# Patient Record
Sex: Female | Born: 1985 | Race: White | Hispanic: No | Marital: Married | Smoking: Former smoker
Health system: Southern US, Community
[De-identification: ages and names within clinical notes are randomized; demographics above are authoritative.]

## PROBLEM LIST (undated history)

## (undated) DIAGNOSIS — R51 Headache: Secondary | ICD-10-CM

## (undated) DIAGNOSIS — I341 Nonrheumatic mitral (valve) prolapse: Secondary | ICD-10-CM

## (undated) DIAGNOSIS — K759 Inflammatory liver disease, unspecified: Secondary | ICD-10-CM

## (undated) DIAGNOSIS — F419 Anxiety disorder, unspecified: Secondary | ICD-10-CM

## (undated) DIAGNOSIS — K219 Gastro-esophageal reflux disease without esophagitis: Secondary | ICD-10-CM

## (undated) DIAGNOSIS — R519 Headache, unspecified: Secondary | ICD-10-CM

## (undated) HISTORY — DX: Headache, unspecified: R51.9

## (undated) HISTORY — DX: Gastro-esophageal reflux disease without esophagitis: K21.9

## (undated) HISTORY — DX: Anxiety disorder, unspecified: F41.9

## (undated) HISTORY — DX: Headache: R51

## (undated) HISTORY — DX: Nonrheumatic mitral (valve) prolapse: I34.1

## (undated) HISTORY — DX: Inflammatory liver disease, unspecified: K75.9

---

## 2010-06-11 HISTORY — PX: LEEP: SHX91

## 2011-01-31 ENCOUNTER — Ambulatory Visit: Payer: Self-pay | Admitting: Obstetrics and Gynecology

## 2011-02-06 ENCOUNTER — Ambulatory Visit: Payer: Self-pay | Admitting: Obstetrics and Gynecology

## 2012-08-08 ENCOUNTER — Ambulatory Visit: Payer: Self-pay

## 2012-08-09 ENCOUNTER — Inpatient Hospital Stay: Payer: Self-pay | Admitting: Obstetrics and Gynecology

## 2012-08-09 LAB — CBC WITH DIFFERENTIAL/PLATELET
Eosinophil #: 0.1 10*3/uL (ref 0.0–0.7)
HCT: 35.2 % (ref 35.0–47.0)
HGB: 11.8 g/dL — ABNORMAL LOW (ref 12.0–16.0)
Lymphocyte %: 14.4 %
MCV: 95 fL (ref 80–100)
Monocyte %: 5.6 %
Neutrophil #: 11 10*3/uL — ABNORMAL HIGH (ref 1.4–6.5)
Platelet: 245 10*3/uL (ref 150–440)
RDW: 14 % (ref 11.5–14.5)
WBC: 14 10*3/uL — ABNORMAL HIGH (ref 3.6–11.0)

## 2012-08-10 LAB — HEMATOCRIT: HCT: 29.8 % — ABNORMAL LOW (ref 35.0–47.0)

## 2012-12-02 ENCOUNTER — Other Ambulatory Visit: Payer: Self-pay | Admitting: Family

## 2012-12-02 LAB — BASIC METABOLIC PANEL
Anion Gap: 3 — ABNORMAL LOW (ref 7–16)
Calcium, Total: 8.7 mg/dL (ref 8.5–10.1)
Chloride: 107 mmol/L (ref 98–107)
Co2: 30 mmol/L (ref 21–32)
Glucose: 92 mg/dL (ref 65–99)
Potassium: 4.8 mmol/L (ref 3.5–5.1)
Sodium: 140 mmol/L (ref 136–145)

## 2012-12-02 LAB — CBC WITH DIFFERENTIAL/PLATELET
Basophil %: 0.4 %
HGB: 13.1 g/dL (ref 12.0–16.0)
Lymphocyte #: 1.7 10*3/uL (ref 1.0–3.6)
Lymphocyte %: 23.8 %
MCH: 30.9 pg (ref 26.0–34.0)
MCHC: 34.9 g/dL (ref 32.0–36.0)
MCV: 89 fL (ref 80–100)
Monocyte #: 0.4 x10 3/mm (ref 0.2–0.9)
Monocyte %: 5.5 %
Neutrophil #: 4.9 10*3/uL (ref 1.4–6.5)
WBC: 7.2 10*3/uL (ref 3.6–11.0)

## 2012-12-02 LAB — LIPID PANEL
Cholesterol: 153 mg/dL (ref 0–200)
Triglycerides: 104 mg/dL (ref 0–200)
VLDL Cholesterol, Calc: 21 mg/dL (ref 5–40)

## 2012-12-02 LAB — TSH: Thyroid Stimulating Horm: 1.64 u[IU]/mL

## 2012-12-29 ENCOUNTER — Ambulatory Visit: Payer: Self-pay | Admitting: Family

## 2013-10-09 ENCOUNTER — Encounter: Payer: Self-pay | Admitting: Gastroenterology

## 2013-12-04 ENCOUNTER — Ambulatory Visit (INDEPENDENT_AMBULATORY_CARE_PROVIDER_SITE_OTHER): Payer: BC Managed Care – PPO | Admitting: Gastroenterology

## 2013-12-04 ENCOUNTER — Encounter: Payer: Self-pay | Admitting: Gastroenterology

## 2013-12-04 VITALS — BP 106/70 | HR 64 | Ht 63.75 in | Wt 172.4 lb

## 2013-12-04 DIAGNOSIS — R09A2 Foreign body sensation, throat: Secondary | ICD-10-CM | POA: Insufficient documentation

## 2013-12-04 DIAGNOSIS — K219 Gastro-esophageal reflux disease without esophagitis: Secondary | ICD-10-CM

## 2013-12-04 DIAGNOSIS — F458 Other somatoform disorders: Secondary | ICD-10-CM | POA: Insufficient documentation

## 2013-12-04 DIAGNOSIS — F449 Dissociative and conversion disorder, unspecified: Secondary | ICD-10-CM

## 2013-12-04 MED ORDER — HYOSCYAMINE SULFATE 0.125 MG SL SUBL: 0.2500 mg | SUBLINGUAL_TABLET | SUBLINGUAL | Status: DC | PRN

## 2013-12-04 NOTE — Progress Notes (Signed)
    _                                                                                                                History of Present Illness: 28 year old white female referred for evaluation of abdominal discomfort and chest discomfort.  For the past year she's had intermittent pyrosis and postprandial discomfort described as a gnawing pain in the upper abdomen and chest.  She denies dysphagia or odynophagia.  At times she spontaneously has what sounds like a globus sensation.  She is taking Nexium with improvement.  Symptoms  tend to recur if she stops Nexium.  She is on no regular gastric irritants including nonsteroidals.    Past Medical History  Diagnosis Date  . Anxiety   . Mitral valve prolapse     Mild   Past Surgical History  Procedure Laterality Date  . Leep  2012    Precancerous cells   family history includes Diabetes in her father, maternal grandfather, maternal grandmother, mother, paternal grandfather, paternal grandmother, and paternal uncle; Heart attack in her mother; Uterine cancer in her maternal grandmother and paternal aunt. Current Outpatient Prescriptions  Medication Sig Dispense Refill  . ALPRAZolam (XANAX) 0.25 MG tablet Take 0.25 mg by mouth as needed for anxiety.      . Esomeprazole Magnesium (NEXIUM 24HR PO) Take 2 tablets by mouth daily.      . norgestimate-ethinyl estradiol (ORTHO-CYCLEN,SPRINTEC,PREVIFEM) 0.25-35 MG-MCG tablet Take 1 tablet by mouth daily.       No current facility-administered medications for this visit.   Allergies as of 12/04/2013 - Review Complete 12/04/2013  Allergen Reaction Noted  . Penicillins Itching 12/04/2013    reports that she quit smoking about 2 years ago. Her smoking use included Cigarettes. She smoked 0.00 packs per day. She has never used smokeless tobacco. She reports that she does not drink alcohol or use illicit drugs.     Review of Systems: Pertinent positive and negative review of systems were  noted in the above HPI section. All other review of systems were otherwise negative.  Vital signs were reviewed in today's medical record Physical Exam: General: Well developed , well nourished, no acute distress Skin: anicteric Head: Normocephalic and atraumatic Eyes:  sclerae anicteric, EOMI Ears: Normal auditory acuity Mouth: No deformity or lesions Neck: Supple, no masses or thyromegaly Lungs: Clear throughout to auscultation Heart: Regular rate and rhythm; no murmurs, rubs or bruits Abdomen: Soft, non tender and non distended. No masses, hepatosplenomegaly or hernias noted. Normal Bowel sounds Rectal:deferred Musculoskeletal: Symmetrical with no gross deformities  Skin: No lesions on visible extremities Pulses:  Normal pulses noted Extremities: No clubbing, cyanosis, edema or deformities noted Neurological: Alert oriented x 4, grossly nonfocal Cervical Nodes:  No significant cervical adenopathy Inguinal Nodes: No significant inguinal adenopathy Psychological:  Alert and cooperative. Normal mood and affect  See Assessment and Plan under Problem List

## 2013-12-04 NOTE — Assessment & Plan Note (Signed)
Symptoms are fairly well-controlled with Nexium.  She was instructed to continue for 2 more months.  I'll prescribe hyomax for nonspecific upper abdominal pain.  Literature on GERD was provided to the patient.

## 2013-12-04 NOTE — Assessment & Plan Note (Signed)
Patient has a history of anxiety.  I explained to her that symptoms of globus probably are a reflection of anxiety.

## 2013-12-04 NOTE — Patient Instructions (Signed)
Gastroesophageal Reflux Disease, Adult  Gastroesophageal reflux disease (GERD) happens when acid from your stomach flows up into the esophagus. When acid comes in contact with the esophagus, the acid causes soreness (inflammation) in the esophagus. Over time, GERD may create small holes (ulcers) in the lining of the esophagus.  CAUSES   · Increased body weight. This puts pressure on the stomach, making acid rise from the stomach into the esophagus.  · Smoking. This increases acid production in the stomach.  · Drinking alcohol. This causes decreased pressure in the lower esophageal sphincter (valve or ring of muscle between the esophagus and stomach), allowing acid from the stomach into the esophagus.  · Late evening meals and a full stomach. This increases pressure and acid production in the stomach.  · A malformed lower esophageal sphincter.  Sometimes, no cause is found.  SYMPTOMS   · Burning pain in the lower part of the mid-chest behind the breastbone and in the mid-stomach area. This may occur twice a week or more often.  · Trouble swallowing.  · Sore throat.  · Dry cough.  · Asthma-like symptoms including chest tightness, shortness of breath, or wheezing.  DIAGNOSIS   Your caregiver may be able to diagnose GERD based on your symptoms. In some cases, X-rays and other tests may be done to check for complications or to check the condition of your stomach and esophagus.  TREATMENT   Your caregiver may recommend over-the-counter or prescription medicines to help decrease acid production. Ask your caregiver before starting or adding any new medicines.   HOME CARE INSTRUCTIONS   · Change the factors that you can control. Ask your caregiver for guidance concerning weight loss, quitting smoking, and alcohol consumption.  · Avoid foods and drinks that make your symptoms worse, such as:  ¨ Caffeine or alcoholic drinks.  ¨ Chocolate.  ¨ Peppermint or mint flavorings.  ¨ Garlic and onions.  ¨ Spicy foods.  ¨ Citrus fruits,  such as oranges, lemons, or limes.  ¨ Tomato-based foods such as sauce, chili, salsa, and pizza.  ¨ Fried and fatty foods.  · Avoid lying down for the 3 hours prior to your bedtime or prior to taking a nap.  · Eat small, frequent meals instead of large meals.  · Wear loose-fitting clothing. Do not wear anything tight around your waist that causes pressure on your stomach.  · Raise the head of your bed 6 to 8 inches with wood blocks to help you sleep. Extra pillows will not help.  · Only take over-the-counter or prescription medicines for pain, discomfort, or fever as directed by your caregiver.  · Do not take aspirin, ibuprofen, or other nonsteroidal anti-inflammatory drugs (NSAIDs).  SEEK IMMEDIATE MEDICAL CARE IF:   · You have pain in your arms, neck, jaw, teeth, or back.  · Your pain increases or changes in intensity or duration.  · You develop nausea, vomiting, or sweating (diaphoresis).  · You develop shortness of breath, or you faint.  · Your vomit is green, yellow, black, or looks like coffee grounds or blood.  · Your stool is red, bloody, or black.  These symptoms could be signs of other problems, such as heart disease, gastric bleeding, or esophageal bleeding.  MAKE SURE YOU:   · Understand these instructions.  · Will watch your condition.  · Will get help right away if you are not doing well or get worse.  Document Released: 03/07/2005 Document Revised: 08/20/2011 Document Reviewed: 12/15/2010  ExitCare® Patient   Information ©2015 [employer]. This information is not intended to replace advice given to you by your health care Joeziah Voit. Make sure you discuss any questions you have with your health care Dymphna Wadley.  Food Choices for Gastroesophageal Reflux Disease  When you have gastroesophageal reflux disease (GERD), the foods you eat and your eating habits are very important. Choosing the right foods can help ease the discomfort of GERD.  WHAT GENERAL GUIDELINES DO I NEED TO FOLLOW?  · Choose fruits,  vegetables, whole grains, low-fat dairy products, and low-fat meat, fish, and poultry.  · Limit fats such as oils, salad dressings, butter, nuts, and avocado.  · Keep a food diary to identify foods that cause symptoms.  · Avoid foods that cause reflux. These may be different for different people.  · Eat frequent small meals instead of three large meals each day.  · Eat your meals slowly, in a relaxed setting.  · Limit fried foods.  · Cook foods using methods other than frying.  · Avoid drinking alcohol.  · Avoid drinking large amounts of liquids with your meals.  · Avoid bending over or lying down until 2-3 hours after eating.  WHAT FOODS ARE NOT RECOMMENDED?  The following are some foods and drinks that may worsen your symptoms:  Vegetables  Tomatoes. Tomato juice. Tomato and spaghetti sauce. Chili peppers. Onion and garlic. Horseradish.  Fruits  Oranges, grapefruit, and lemon (fruit and juice).  Meats  High-fat meats, fish, and poultry. This includes hot dogs, ribs, ham, sausage, salami, and bacon.  Dairy  Whole milk and chocolate milk. Sour cream. Cream. Butter. Ice cream. Cream cheese.   Beverages  Coffee and tea, with or without caffeine. Carbonated beverages or energy drinks.  Condiments  Hot sauce. Barbecue sauce.   Sweets/Desserts  Chocolate and cocoa. Donuts. Peppermint and spearmint.  Fats and Oils  High-fat foods, including French fries and potato chips.  Other  Vinegar. Strong spices, such as black pepper, white pepper, red pepper, cayenne, curry powder, cloves, ginger, and chili powder.  The items listed above may not be a complete list of foods and beverages to avoid. Contact your dietitian for more information.  Document Released: 05/28/2005 Document Revised: 06/02/2013 Document Reviewed: 04/01/2013  ExitCare® Patient Information ©2015 [employer]. This information is not intended to replace advice given to you by your health care Chaze Hruska. Make sure you discuss any questions you have with your  health care Janani Chamber.

## 2014-10-19 NOTE — H&P (Signed)
L&D Evaluation:  History:  HPI G3P1011 at [redacted]w[redacted]d presents with c/o LOF at 8:10am today, now with worsening contractions.  PNC at [facility].  PNL - B+, RI, VI, ASCUS-H pap 3/13, normal pap 7/13, GBS neg.  Recieved TDaP 06/16/12.   Presents with leaking fluid   Patient's Medical History No Chronic Illness   Patient's Surgical History LEEP   Medications Pre Natal Vitamins   Allergies PCN   Social History none   Family History Non-Contributory   ROS:  ROS All systems were reviewed.  HEENT, CNS, GI, GU, Respiratory, CV, Renal and Musculoskeletal systems were found to be normal.   Exam:  Vital Signs stable   General no apparent distress   Mental Status clear   Chest nl effort   Abdomen gravid, non-tender   Estimated Fetal Weight Average for gestational age, 9#   Pelvic no external lesions, 5 / 44 / -2, artificial rupture of membranes of forebag   Mebranes Ruptured   Description clear   FHT reactive NST   Ucx irregular, every 3-7 mins   Impression:  Impression G3P1 at [redacted]w[redacted]d with SROM this am.   Plan:  Plan EFM/NST, monitor contractions and for cervical change   Comments - will augment with pitocin if needed   Electronic Signatures: Malachi Paradise (MD)  (Signed 01-Mar-14 11:24)  Authored: L&D Evaluation   Last Updated: 01-Mar-14 11:24 by Thurmond Butts, Elinor Parkinson (MD)

## 2015-09-27 DIAGNOSIS — Z8639 Personal history of other endocrine, nutritional and metabolic disease: Secondary | ICD-10-CM | POA: Diagnosis not present

## 2015-09-27 DIAGNOSIS — Z01419 Encounter for gynecological examination (general) (routine) without abnormal findings: Secondary | ICD-10-CM | POA: Diagnosis not present

## 2015-10-25 ENCOUNTER — Emergency Department
Admission: EM | Admit: 2015-10-25 | Discharge: 2015-10-25 | Disposition: A | Discharge status: Home / Self Care | Payer: BLUE CROSS/BLUE SHIELD | Attending: Emergency Medicine | Admitting: Emergency Medicine

## 2015-10-25 ENCOUNTER — Encounter: Payer: Self-pay | Admitting: Emergency Medicine

## 2015-10-25 DIAGNOSIS — R51 Headache: Secondary | ICD-10-CM | POA: Insufficient documentation

## 2015-10-25 DIAGNOSIS — N390 Urinary tract infection, site not specified: Secondary | ICD-10-CM | POA: Diagnosis not present

## 2015-10-25 DIAGNOSIS — Z87891 Personal history of nicotine dependence: Secondary | ICD-10-CM | POA: Insufficient documentation

## 2015-10-25 DIAGNOSIS — R42 Dizziness and giddiness: Secondary | ICD-10-CM

## 2015-10-25 DIAGNOSIS — Z79899 Other long term (current) drug therapy: Secondary | ICD-10-CM | POA: Insufficient documentation

## 2015-10-25 DIAGNOSIS — R519 Headache, unspecified: Secondary | ICD-10-CM

## 2015-10-25 LAB — URINALYSIS COMPLETE WITH MICROSCOPIC (ARMC ONLY)
Bilirubin Urine: NEGATIVE
Glucose, UA: NEGATIVE mg/dL
HGB URINE DIPSTICK: NEGATIVE
KETONES UR: NEGATIVE mg/dL
NITRITE: NEGATIVE
PH: 7 (ref 5.0–8.0)
PROTEIN: NEGATIVE mg/dL
Specific Gravity, Urine: 1.01 (ref 1.005–1.030)

## 2015-10-25 LAB — MAGNESIUM: Magnesium: 2 mg/dL (ref 1.7–2.4)

## 2015-10-25 LAB — CBC WITH DIFFERENTIAL/PLATELET
Basophils Absolute: 0 10*3/uL (ref 0–0.1)
EOS ABS: 0.1 10*3/uL (ref 0–0.7)
HCT: 40 % (ref 35.0–47.0)
HEMOGLOBIN: 13.7 g/dL (ref 12.0–16.0)
LYMPHS ABS: 1.1 10*3/uL (ref 1.0–3.6)
Lymphocytes Relative: 12 %
MCH: 30.3 pg (ref 26.0–34.0)
MCHC: 34.3 g/dL (ref 32.0–36.0)
MCV: 88.4 fL (ref 80.0–100.0)
MONO ABS: 0.4 10*3/uL (ref 0.2–0.9)
Neutro Abs: 8.1 10*3/uL — ABNORMAL HIGH (ref 1.4–6.5)
PLATELETS: 241 10*3/uL (ref 150–440)
RBC: 4.52 MIL/uL (ref 3.80–5.20)
RDW: 13.5 % (ref 11.5–14.5)
WBC: 9.7 10*3/uL (ref 3.6–11.0)

## 2015-10-25 LAB — BASIC METABOLIC PANEL
Anion gap: 5 (ref 5–15)
BUN: 15 mg/dL (ref 6–20)
CHLORIDE: 103 mmol/L (ref 101–111)
CO2: 28 mmol/L (ref 22–32)
Calcium: 9.2 mg/dL (ref 8.9–10.3)
Creatinine, Ser: 0.61 mg/dL (ref 0.44–1.00)
GLUCOSE: 117 mg/dL — AB (ref 65–99)
Potassium: 3.8 mmol/L (ref 3.5–5.1)
SODIUM: 136 mmol/L (ref 135–145)

## 2015-10-25 LAB — POCT PREGNANCY, URINE: PREG TEST UR: NEGATIVE

## 2015-10-25 MED ORDER — METOCLOPRAMIDE HCL 5 MG/ML IJ SOLN
INTRAMUSCULAR | Status: AC
  Administered 2015-10-25: 10 mg via INTRAVENOUS
  Filled 2015-10-25: qty 2

## 2015-10-25 MED ORDER — MECLIZINE HCL 25 MG PO TABS: 25.0000 mg | ORAL_TABLET | Freq: Three times a day (TID) | ORAL | Status: DC | PRN

## 2015-10-25 MED ORDER — SODIUM CHLORIDE 0.9 % IV BOLUS (SEPSIS)
500.0000 mL | INTRAVENOUS | Status: AC
  Administered 2015-10-25: 500 mL via INTRAVENOUS

## 2015-10-25 MED ORDER — DEXAMETHASONE SODIUM PHOSPHATE 10 MG/ML IJ SOLN
10.0000 mg | Freq: Once | INTRAMUSCULAR | Status: AC
  Administered 2015-10-25: 10 mg via INTRAVENOUS

## 2015-10-25 MED ORDER — KETOROLAC TROMETHAMINE 30 MG/ML IJ SOLN
10.0000 mg | Freq: Once | INTRAMUSCULAR | Status: AC
  Administered 2015-10-25: 9.9 mg via INTRAVENOUS

## 2015-10-25 MED ORDER — CEPHALEXIN 500 MG PO CAPS: 500.0000 mg | ORAL_CAPSULE | Freq: Two times a day (BID) | ORAL | Status: DC

## 2015-10-25 MED ORDER — METOCLOPRAMIDE HCL 5 MG/ML IJ SOLN
10.0000 mg | INTRAMUSCULAR | Status: AC
  Administered 2015-10-25: 10 mg via INTRAVENOUS

## 2015-10-25 MED ORDER — DIPHENHYDRAMINE HCL 50 MG/ML IJ SOLN
12.5000 mg | Freq: Once | INTRAMUSCULAR | Status: AC
  Administered 2015-10-25: 12.5 mg via INTRAVENOUS

## 2015-10-25 MED ORDER — DIPHENHYDRAMINE HCL 50 MG/ML IJ SOLN
INTRAMUSCULAR | Status: AC
  Administered 2015-10-25: 12.5 mg via INTRAVENOUS
  Filled 2015-10-25: qty 1

## 2015-10-25 MED ORDER — DEXAMETHASONE SODIUM PHOSPHATE 10 MG/ML IJ SOLN
INTRAMUSCULAR | Status: AC
  Administered 2015-10-25: 10 mg via INTRAVENOUS
  Filled 2015-10-25: qty 1

## 2015-10-25 MED ORDER — KETOROLAC TROMETHAMINE 30 MG/ML IJ SOLN
INTRAMUSCULAR | Status: AC
  Administered 2015-10-25: 9.9 mg via INTRAVENOUS
  Filled 2015-10-25: qty 1

## 2015-10-25 NOTE — Discharge Instructions (Signed)
As we discussed, we do not believe that a serious or emergent medical condition is causing her symptoms.  It is likely related to benign vertigo or possibly an atypical migraine headache.  You have no neurological deficits at this time that we believe it is safe for you to follow up as an outpatient.  Please take the prescribed medications as indicated on the label instructions and follow up with your PCP at the next available opportunity.  Return to the emergency department if you develop new or worsening symptoms that concern you.   Benign Positional Vertigo Vertigo is the feeling that you or your surroundings are moving when they are not. Benign positional vertigo is the most common form of vertigo. The cause of this condition is not serious (is benign). This condition is triggered by certain movements and positions (is positional). This condition can be dangerous if it occurs while you are doing something that could endanger you or others, such as driving.  CAUSES In many cases, the cause of this condition is not known. It may be caused by a disturbance in an area of the inner ear that helps your brain to sense movement and balance. This disturbance can be caused by a viral infection (labyrinthitis), head injury, or repetitive motion. RISK FACTORS This condition is more likely to develop in:  Women.  People who are 45 years of age or older. SYMPTOMS Symptoms of this condition usually happen when you move your head or your eyes in different directions. Symptoms may start suddenly, and they usually last for less than a minute. Symptoms may include:  Loss of balance and falling.  Feeling like you are spinning or moving.  Feeling like your surroundings are spinning or moving.  Nausea and vomiting.  Blurred vision.  Dizziness.  Involuntary eye movement (nystagmus). Symptoms can be mild and cause only slight annoyance, or they can be severe and interfere with daily life. Episodes of benign  positional vertigo may return (recur) over time, and they may be triggered by certain movements. Symptoms may improve over time. DIAGNOSIS This condition is usually diagnosed by medical history and a physical exam of the head, neck, and ears. You may be referred to a health care provider who specializes in ear, nose, and throat (ENT) problems (otolaryngologist) or a provider who specializes in disorders of the nervous system (neurologist). You may have additional testing, including:  MRI.  A CT scan.  Eye movement tests. Your health care provider may ask you to change positions quickly while he or she watches you for symptoms of benign positional vertigo, such as nystagmus. Eye movement may be tested with an electronystagmogram (ENG), caloric stimulation, the Dix-Hallpike test, or the roll test.  An electroencephalogram (EEG). This records electrical activity in your brain.  Hearing tests. TREATMENT Usually, your health care provider will treat this by moving your head in specific positions to adjust your inner ear back to normal. Surgery may be needed in severe cases, but this is rare. In some cases, benign positional vertigo may resolve on its own in 2-4 weeks. HOME CARE INSTRUCTIONS Safety  Move slowly.Avoid sudden body or head movements.  Avoid driving.  Avoid operating heavy machinery.  Avoid doing any tasks that would be dangerous to you or others if a vertigo episode would occur.  If you have trouble walking or keeping your balance, try using a cane for stability. If you feel dizzy or unstable, sit down right away.  Return to your normal activities as told  by your health care provider. Ask your health care provider what activities are safe for you. General Instructions  Take over-the-counter and prescription medicines only as told by your health care provider.  Avoid certain positions or movements as told by your health care provider.  Drink enough fluid to keep your urine  clear or pale yellow.  Keep all follow-up visits as told by your health care provider. This is important. SEEK MEDICAL CARE IF:  You have a fever.  Your condition gets worse or you develop new symptoms.  Your family or friends notice any behavioral changes.  Your nausea or vomiting gets worse.  You have numbness or a "pins and needles" sensation. SEEK IMMEDIATE MEDICAL CARE IF:  You have difficulty speaking or moving.  You are always dizzy.  You faint.  You develop severe headaches.  You have weakness in your legs or arms.  You have changes in your hearing or vision.  You develop a stiff neck.  You develop sensitivity to light.   This information is not intended to replace advice given to you by your health care provider. Make sure you discuss any questions you have with your health care provider.   Document Released: 03/05/2006 Document Revised: 02/16/2015 Document Reviewed: 09/20/2014 Elsevier Interactive Patient Education 2016 [place] Headache Without Cause A headache is pain or discomfort felt around the head or neck area. There are many causes and types of headaches. In some cases, the cause may not be found.  HOME CARE  Managing Pain  Take over-the-counter and prescription medicines only as told by your doctor.  Lie down in a dark, quiet room when you have a headache.  If directed, apply ice to the head and neck area:  Put ice in a plastic bag.  Place a towel between your skin and the bag.  Leave the ice on for 20 minutes, 2-3 times per day.  Use a heating pad or hot shower to apply heat to the head and neck area as told by your doctor.  Keep lights dim if bright lights bother you or make your headaches worse. Eating and Drinking  Eat meals on a regular schedule.  Lessen how much alcohol you drink.  Lessen how much caffeine you drink, or stop drinking caffeine. General Instructions  Keep all follow-up visits as told by your  doctor. This is important.  Keep a journal to find out if certain things bring on headaches. For example, write down:  What you eat and drink.  How much sleep you get.  Any change to your diet or medicines.  Relax by getting a massage or doing other relaxing activities.  Lessen stress.  Sit up straight. Do not tighten (tense) your muscles.  Do not use tobacco products. This includes cigarettes, chewing tobacco, or e-cigarettes. If you need help quitting, ask your doctor.  Exercise regularly as told by your doctor.  Get enough sleep. This often means 7-9 hours of sleep. GET HELP IF:  Your symptoms are not helped by medicine.  You have a headache that feels different than the other headaches.  You feel sick to your stomach (nauseous) or you throw up (vomit).  You have a fever. GET HELP RIGHT AWAY IF:   Your headache becomes really bad.  You keep throwing up.  You have a stiff neck.  You have trouble seeing.  You have trouble speaking.  You have pain in the eye or ear.  Your muscles are weak or you lose  muscle control.  You lose your balance or have trouble walking.  You feel like you will pass out (faint) or you pass out.  You have confusion.   This information is not intended to replace advice given to you by your health care provider. Make sure you discuss any questions you have with your health care provider.   Document Released: 03/06/2008 Document Revised: 02/16/2015 Document Reviewed: 09/20/2014 Elsevier Interactive Patient Education [employer].

## 2015-10-25 NOTE — ED Notes (Signed)
AAOx3.  Skin warm and dry.  NAD.  Moving all extremities equally and strong.  Stands well.  Denies dizziness.  Ambulates with easy and steady gait.

## 2015-10-25 NOTE — ED Notes (Signed)
AAOx3.  Skin warm and dry.  NAD 

## 2015-10-25 NOTE — ED Provider Notes (Addendum)
[facility] Emergency Department Provider Note  ____________________________________________  Time seen: Approximately 3:11 PM  I have reviewed the triage vital signs and the nursing notes.   HISTORY  Chief Complaint Dizziness and Headache    HPI Kiara Rich is a 30 y.o. female with past medical history of anxiety and mitral valve prolapse who presents for evaluation of dizziness.  She reports that she woke up with it this morning.  It does feel similar to prior episodes she has had in the past but feels more severe.  She describes that when she first stood up she felt like she was being pulled to the right and off balance.  She has had a dull and throbbing headache today that is mild in intensity currently but was moderate earlier today.  She has not had any trauma or injuries.  She denies fever/chills, visual changes, chest pain, shortness of breath.  She had one episode of nausea and vomiting associated with her symptoms earlier today.  She did have similar symptoms yesterday and felt better after getting some more sleep.  She has been eating and drinking normally recently and has not had any dysuria, vomiting, nor diarrhea.  Nothing specifically made her feel better although she did say that the feeling of being off balance is gone, the dizziness is much better, and the headache is very mild at this time.  Nothing in particular made her symptoms worse.   Past Medical History  Diagnosis Date  . Anxiety   . Mitral valve prolapse     Mild    Patient Active Problem List   Diagnosis Date Noted  . Esophageal reflux 12/04/2013  . 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 Leep  2012    Precancerous cells    Current Outpatient Rx  Name  Route  Sig  Dispense  Refill  . ALPRAZolam (XANAX) 0.25 MG tablet   Oral   Take 0.25 mg by mouth as needed for anxiety.         . cephALEXin (KEFLEX) 500 MG capsule    Oral   Take 1 capsule (500 mg total) by mouth 2 (two) times daily.   14 capsule   0   . Esomeprazole Magnesium (NEXIUM 24HR PO)   Oral   Take 2 tablets by mouth daily.         . hyoscyamine (LEVSIN SL) 0.125 MG SL tablet   Sublingual   Place 2 tablets (0.25 mg total) under the tongue every 4 (four) hours as needed.   30 tablet   2   . meclizine (ANTIVERT) 25 MG tablet   Oral   Take 1 tablet (25 mg total) by mouth 3 (three) times daily as needed for dizziness.   30 tablet   0   . norgestimate-ethinyl estradiol (ORTHO-CYCLEN,SPRINTEC,PREVIFEM) 0.25-35 MG-MCG tablet   Oral   Take 1 tablet by mouth daily.           Allergies Penicillins  Family History  Problem Relation Age of Onset  . Uterine cancer Maternal Grandmother   . Diabetes Father   . Diabetes Mother   . Heart attack Mother   . Uterine cancer Paternal Aunt   . Diabetes Paternal Uncle   . Diabetes Paternal Grandfather   . Diabetes Paternal Grandmother   . Diabetes Maternal Grandfather   . Diabetes Maternal Grandmother     Social History Social History  Substance Use Topics  .  Smoking status: Former Smoker    Types: Cigarettes    Quit date: 06/12/2011  . Smokeless tobacco: Never Used  . Alcohol Use: No    Review of Systems Constitutional: No fever/chills Eyes: No visual changes. ENT: No sore throat. Cardiovascular: Denies chest pain. Respiratory: Denies shortness of breath. Gastrointestinal: No abdominal pain.  N/V x 1.  No diarrhea.  No constipation. Genitourinary: Negative for dysuria. Musculoskeletal: Negative for back pain. Skin: Negative for rash. Neurological: Dizziness when she got up this morning and felt off balance as if falling to the right.  Mild throbbing headache worse in the back of her head.  10-point ROS otherwise negative.  ____________________________________________   PHYSICAL EXAM:  VITAL SIGNS: ED Triage Vitals  Enc Vitals Group     BP 10/25/15 1135 106/59 mmHg      Pulse Rate 10/25/15 1135 57     Resp 10/25/15 1135 20     Temp 10/25/15 1135 97.7 F (36.5 C)     Temp Source 10/25/15 1135 Oral     SpO2 10/25/15 1135 99 %     Weight 10/25/15 1135 158 lb (71.668 kg)     Height 10/25/15 1135 5\' 4"  (1.626 m)     Head Cir --      Peak Flow --      Pain Score 10/25/15 1144 2     Pain Loc --      Pain Edu? --      Excl. in [place]? --     Constitutional: Alert and oriented. Well appearing and in no acute distress. Eyes: Conjunctivae are normal. PERRL. EOMI. No papilledema on funduscopic exam.  No photophobia. Head: Atraumatic. Nose: No congestion/rhinnorhea. Mouth/Throat: Mucous membranes are moist.  Oropharynx non-erythematous. Neck: No stridor.  No meningeal signs.   Cardiovascular: Normal rate, regular rhythm. Good peripheral circulation. Grossly normal heart sounds.   Respiratory: Normal respiratory effort.  No retractions. Lungs CTAB. Gastrointestinal: Soft and nontender. No distention.  Musculoskeletal: No lower extremity tenderness nor edema. No gross deformities of extremities. Neurologic:  Normal speech and language.  No gross cranial nerve deficits.  Normal speech.  Romberg is negative.  No dysmetria on finger to nose testing.  No pronator drift.  Normal gait with no imbalance perceived by the patient nor by me as an observer. Skin:  Skin is warm, dry and intact. No rash noted. Psychiatric: Mood and affect are normal. Speech and behavior are normal.  ____________________________________________   LABS (all labs ordered are listed, but only abnormal results are displayed)  Labs Reviewed  CBC WITH DIFFERENTIAL/PLATELET - Abnormal; Notable for the following:    Neutro Abs 8.1 (*)    All other components within normal limits  BASIC METABOLIC PANEL - Abnormal; Notable for the following:    Glucose, Bld 117 (*)    All other components within normal limits  URINALYSIS COMPLETEWITH MICROSCOPIC ([facility] ONLY) - Abnormal; Notable for the  following:    Color, Urine STRAW (*)    APPearance CLEAR (*)    Leukocytes, UA 3+ (*)    Bacteria, UA FEW (*)    Squamous Epithelial / LPF 6-30 (*)    All other components within normal limits  URINE CULTURE  MAGNESIUM  POC URINE PREG, ED  POCT PREGNANCY, URINE   ____________________________________________  EKG  None ____________________________________________  RADIOLOGY   No results found.  ____________________________________________   PROCEDURES  Procedure(s) performed: None  Critical Care performed: No ____________________________________________   INITIAL IMPRESSION / ASSESSMENT AND PLAN /  ED COURSE  Pertinent labs & imaging results that were available during my care of the patient were reviewed by me and considered in my medical decision making (see chart for details).  I had a lengthy discussion with the patient and her husband about the various possibilities to explain her symptoms, including but not limited to benign vertigo, stroke, intracranial hemorrhage, migraine, pseudotumor cerebri.  However, I explained why I did not feel any of the latter possibilities was likely.  She has absolutely no neurological deficits at this time and the possibility of a serious intracranial abnormality is incredibly low.  I explained why I would not recommend a CT scan of her head nor an MRI at this time, but offered that if she needs the reassurance I would obtain the noncontrast CT head.  She understands our discussion and the low probability of an inch cranial issue and agreed that instead we will treat it as a migraine with my usual "migraine cocktail" and that she will pursue outpatient follow-up with her PCP.  I think this is appropriate and   I gave my usual and customary return precautions.    Additionally, her urinalysis reveals that she has a mild UTI.  I ordered a urine culture and will treat her empirically with Keflex.  Her lab work is otherwise  unremarkable.  ----------------------------------------- 5:29 PM on 10/25/2015 -----------------------------------------  The patient feels better after her medications, although she did have an episode of the more classic vertigo feelings of feeling dizzy with turning her head to the right much improved when she turned back to the left.  I am giving her prescription for Antivert as well as for Keflex for the UTI.  I discussed all this with her and she is comfortable with the plan for discharge and outpatient follow-up.  I gave my usual and customary return precautions.     ____________________________________________  FINAL CLINICAL IMPRESSION(S) / ED DIAGNOSES  Final diagnoses:  Nonintractable headache  Dizziness  UTI (lower urinary tract infection)     MEDICATIONS GIVEN DURING THIS VISIT:  Medications  diphenhydrAMINE (BENADRYL) injection 12.5 mg (12.5 mg Intravenous Given 10/25/15 1546)  sodium chloride 0.9 % bolus 500 mL (0 mLs Intravenous Stopped 10/25/15 1655)  metoCLOPramide (REGLAN) injection 10 mg (10 mg Intravenous Given 10/25/15 1548)  dexamethasone (DECADRON) injection 10 mg (10 mg Intravenous Given 10/25/15 1547)  ketorolac (TORADOL) 30 MG/ML injection 9.9 mg (9.9 mg Intravenous Given 10/25/15 1549)     NEW OUTPATIENT MEDICATIONS STARTED DURING THIS VISIT:  New Prescriptions   CEPHALEXIN (KEFLEX) 500 MG CAPSULE    Take 1 capsule (500 mg total) by mouth 2 (two) times daily.   MECLIZINE (ANTIVERT) 25 MG TABLET    Take 1 tablet (25 mg total) by mouth 3 (three) times daily as needed for dizziness.      Note:  This document was prepared using Dragon voice recognition software and may include unintentional dictation errors.   Hinda Kehr, MD 10/25/15 [place], MD 10/25/15 [phone]

## 2015-10-25 NOTE — ED Notes (Signed)
States woke up dizzy this morning.  Also some nausea and vomited x1.  States felt this way as well yesterday and had a headache, symptoms improved after sleeping some more.  States feels as if she is being pulled to the right when walking.  Also c/o a dull ache / tension feeling in head today.

## 2015-10-26 LAB — URINE CULTURE
Culture: NO GROWTH
Special Requests: NORMAL

## 2016-09-05 DIAGNOSIS — R002 Palpitations: Secondary | ICD-10-CM | POA: Diagnosis not present

## 2016-09-05 DIAGNOSIS — I499 Cardiac arrhythmia, unspecified: Secondary | ICD-10-CM | POA: Diagnosis not present

## 2016-09-05 DIAGNOSIS — H81319 Aural vertigo, unspecified ear: Secondary | ICD-10-CM | POA: Diagnosis not present

## 2016-09-11 DIAGNOSIS — I499 Cardiac arrhythmia, unspecified: Secondary | ICD-10-CM | POA: Diagnosis not present

## 2016-09-11 DIAGNOSIS — I341 Nonrheumatic mitral (valve) prolapse: Secondary | ICD-10-CM | POA: Diagnosis not present

## 2016-09-11 DIAGNOSIS — R002 Palpitations: Secondary | ICD-10-CM | POA: Diagnosis not present

## 2016-09-11 DIAGNOSIS — R079 Chest pain, unspecified: Secondary | ICD-10-CM | POA: Diagnosis not present

## 2016-10-03 DIAGNOSIS — R002 Palpitations: Secondary | ICD-10-CM | POA: Diagnosis not present

## 2016-10-03 DIAGNOSIS — Z0000 Encounter for general adult medical examination without abnormal findings: Secondary | ICD-10-CM | POA: Diagnosis not present

## 2016-10-03 DIAGNOSIS — Z8349 Family history of other endocrine, nutritional and metabolic diseases: Secondary | ICD-10-CM | POA: Diagnosis not present

## 2016-10-03 DIAGNOSIS — Z87898 Personal history of other specified conditions: Secondary | ICD-10-CM | POA: Diagnosis not present

## 2017-02-14 ENCOUNTER — Emergency Department (HOSPITAL_COMMUNITY): Payer: BLUE CROSS/BLUE SHIELD

## 2017-02-14 ENCOUNTER — Encounter (HOSPITAL_COMMUNITY): Payer: Self-pay | Admitting: *Deleted

## 2017-02-14 ENCOUNTER — Emergency Department (HOSPITAL_COMMUNITY)
Admission: EM | Admit: 2017-02-14 | Discharge: 2017-02-14 | Disposition: A | Discharge status: Home / Self Care | Payer: BLUE CROSS/BLUE SHIELD | Attending: Emergency Medicine | Admitting: Emergency Medicine

## 2017-02-14 DIAGNOSIS — Z87891 Personal history of nicotine dependence: Secondary | ICD-10-CM | POA: Diagnosis not present

## 2017-02-14 DIAGNOSIS — R0789 Other chest pain: Secondary | ICD-10-CM | POA: Insufficient documentation

## 2017-02-14 DIAGNOSIS — Z8679 Personal history of other diseases of the circulatory system: Secondary | ICD-10-CM | POA: Diagnosis not present

## 2017-02-14 DIAGNOSIS — R0602 Shortness of breath: Secondary | ICD-10-CM | POA: Diagnosis not present

## 2017-02-14 DIAGNOSIS — R079 Chest pain, unspecified: Secondary | ICD-10-CM | POA: Diagnosis not present

## 2017-02-14 LAB — BASIC METABOLIC PANEL
ANION GAP: 7 (ref 5–15)
BUN: 8 mg/dL (ref 6–20)
CHLORIDE: 104 mmol/L (ref 101–111)
CO2: 28 mmol/L (ref 22–32)
Calcium: 9.7 mg/dL (ref 8.9–10.3)
Creatinine, Ser: 0.73 mg/dL (ref 0.44–1.00)
Glucose, Bld: 86 mg/dL (ref 65–99)
POTASSIUM: 4 mmol/L (ref 3.5–5.1)
SODIUM: 139 mmol/L (ref 135–145)

## 2017-02-14 LAB — CBC
HEMATOCRIT: 38 % (ref 36.0–46.0)
HEMOGLOBIN: 12.3 g/dL (ref 12.0–15.0)
MCH: 29.3 pg (ref 26.0–34.0)
MCHC: 32.4 g/dL (ref 30.0–36.0)
MCV: 90.5 fL (ref 78.0–100.0)
Platelets: 262 10*3/uL (ref 150–400)
RBC: 4.2 MIL/uL (ref 3.87–5.11)
RDW: 12.7 % (ref 11.5–15.5)
WBC: 9.3 10*3/uL (ref 4.0–10.5)

## 2017-02-14 LAB — I-STAT TROPONIN, ED: Troponin i, poc: 0 ng/mL (ref 0.00–0.08)

## 2017-02-14 LAB — I-STAT BETA HCG BLOOD, ED (MC, WL, AP ONLY)

## 2017-02-14 LAB — D-DIMER, QUANTITATIVE (NOT AT ARMC)

## 2017-02-14 NOTE — ED Notes (Signed)
Pt states pain starts to have pain in the lung for 3 weeks. Pt denise any leg swelling.

## 2017-02-14 NOTE — ED Provider Notes (Signed)
[facility] DEPT Provider Note   CSN: [id number] Arrival date & time: 02/14/17  1655     History   Chief Complaint Chief Complaint  Patient presents with  . Shortness of Breath  . Chest Pain    HPI Kiara Rich is a 31 y.o. female.  HPI   31 year old female with hx of mitral valve prolapse, GERD and anxiety here with c/o SOB. Patient states she has had recurrent vague chest discomfort ongoing for the past 4 years, has been seen by a cardiologist with this and had an echo that shows some mitral valve prolapse. For the past 3 weeks she has been experiencing increasing pain in the chest. She described the chest pain as a sharp sensation, running across her chest, lasting for seconds, usually brought on by stress. She endorses occasional nonproductive cough. She also endorsed having pleuritic chest pain and shortness of breath with these episodes, worse with exertion. She does have a history of GERD but states that this pain felt different. Does report family history of cardiac disease but no premature cardiac death. No prior history of PE or DVT, no recent surgery, prolonged bed rest, unilateral leg swelling or calf pain, active cancer, hemoptysis. She is not on any birth control pill. She is nonsmoker, denies alcohol abuse. She does admits to having increasing stress without SI or HI. She denies having any active pain currently. States occasional sharp pain in the left chest early in the day.  Past Medical History:  Diagnosis Date  . Anxiety   . Mitral valve prolapse    Mild    Patient Active Problem List   Diagnosis Date Noted  . Esophageal reflux 12/04/2013  . 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 LEEP  2012   Precancerous cells    OB History    No data available       Home Medications    Prior to Admission medications   Medication Sig Start Date End Date Taking? Authorizing Provider  ALPRAZolam Duanne Moron) 0.25 MG  tablet Take 0.25 mg by mouth as needed for anxiety.    [provider]  cephALEXin (KEFLEX) 500 MG capsule Take 1 capsule (500 mg total) by mouth 2 (two) times daily. 10/25/15   Hinda Kehr, MD  Esomeprazole Magnesium (NEXIUM 24HR PO) Take 2 tablets by mouth daily.    [provider]  hyoscyamine (LEVSIN SL) 0.125 MG SL tablet Place 2 tablets (0.25 mg total) under the tongue every 4 (four) hours as needed. 12/04/13   Inda Castle, MD  meclizine (ANTIVERT) 25 MG tablet Take 1 tablet (25 mg total) by mouth 3 (three) times daily as needed for dizziness. 10/25/15   Hinda Kehr, MD  norgestimate-ethinyl estradiol (ORTHO-CYCLEN,SPRINTEC,PREVIFEM) 0.25-35 MG-MCG tablet Take 1 tablet by mouth daily.    [provider]    Family History Family History  Problem Relation Age of Onset  . Uterine cancer Maternal Grandmother   . Diabetes Maternal Grandmother   . Diabetes Father   . Diabetes Mother   . Heart attack Mother   . Uterine cancer Paternal Aunt   . Diabetes Paternal Uncle   . Diabetes Paternal Grandfather   . Diabetes Paternal Grandmother   . Diabetes Maternal Grandfather     Social History Social History  Substance Use Topics  . Smoking status: Former Smoker    Types: Cigarettes    Quit date: 06/12/2011  . Smokeless tobacco: Never Used  .  Alcohol use No     Allergies   Penicillins   Review of Systems Review of Systems  All other systems reviewed and are negative.    Physical Exam Updated Vital Signs BP 111/70 (BP Location: Left Arm)   Pulse (!) 59   Temp 99.3 F (37.4 C) (Oral)   Resp 18   LMP 02/04/2017   SpO2 100%   Physical Exam  Constitutional: She appears well-developed and well-nourished. No distress.  HENT:  Head: Atraumatic.  Eyes: Conjunctivae are normal.  Neck: Neck supple.  Cardiovascular: Normal rate, regular rhythm and intact distal pulses.   Pulmonary/Chest: Effort normal and breath sounds normal.  Abdominal: Soft.  Bowel sounds are normal. She exhibits no distension. There is no tenderness.  Musculoskeletal: She exhibits no edema.  Neurological: She is alert.  Skin: No rash noted.  Psychiatric: She has a normal mood and affect.  Nursing note and vitals reviewed.    ED Treatments / Results  Labs (all labs ordered are listed, but only abnormal results are displayed) Labs Reviewed  BASIC METABOLIC PANEL  CBC  D-DIMER, QUANTITATIVE (NOT AT [facility])  I-STAT TROPONIN, ED  I-STAT BETA HCG BLOOD, ED ([facility], [facility], [facility] ONLY)    EKG  EKG Interpretation  Date/Time:  Thursday February 14 2017 17:07:02 EDT Ventricular Rate:  63 PR Interval:  156 QRS Duration: 72 QT Interval:  428 QTC Calculation: 437 R Axis:   53 Text Interpretation:  Normal sinus rhythm Normal ECG No prior ECG for comparison.  No STEMI Confirmed by Antony Blackbird [phone]) on 02/14/2017 8:40:42 PM       Radiology Dg Chest 2 View  Result Date: 02/14/2017 CLINICAL DATA:  Chest pain and shortness breath. EXAM: CHEST  2 VIEW COMPARISON:  None. FINDINGS: The cardiomediastinal contours are normal. The lungs are clear. Pulmonary vasculature is normal. No consolidation, pleural effusion, or pneumothorax. No acute osseous abnormalities are seen. IMPRESSION: No acute pulmonary process. Electronically Signed   By: Jeb Levering M.D.   On: 02/14/2017 18:20    Procedures Procedures (including critical care time)  Medications Ordered in ED Medications - No data to display   Initial Impression / Assessment and Plan / ED Course  I have reviewed the triage vital signs and the nursing notes.  Pertinent labs & imaging results that were available during my care of the patient were reviewed by me and considered in my medical decision making (see chart for details).    BP 100/75   Pulse 71   Temp 99.3 F (37.4 C) (Oral)   Resp 17   LMP 02/04/2017   SpO2 99%    Final Clinical Impressions(s) / ED Diagnoses   Final diagnoses:  Atypical chest  pain    New Prescriptions New Prescriptions   No medications on file   9:38 PM Patient here with atypical chest pain, low suspicion for ACS. Her heart score is 1, low risk of MACE.  She however complaining of having pleuritic chest pain and shortness of breath therefore a d-dimer ordered to assess for potential PE. She does not have any active chest pain at this time. She is afebrile with stable normal vital sign. A chest x-ray is unremarkable, labs are reassuring.  11:28 PM D-dimer negative, preg test negative, EKG and trop neg, normal electrolytes, normal H&H, CXR negative.  Pt currently sxs free.  Reassurance given.  Recommend outpt f/u with PCP for further care.  Return precaution given.     Domenic Moras, PA-C 02/14/17 2330  Tegeler, Gwenyth Allegra, MD 02/14/17 [phone]

## 2017-02-14 NOTE — ED Triage Notes (Signed)
Pt states that she has been having chest pain and SOB for the last several days. Pt stats that is it worse with breathing. Pain is intermittent. Pt reports a pressure sensation in her head when she takes a deep breath, pt stats that this has all been ongoing to 4 years but states that it has worsened recently.

## 2017-02-14 NOTE — ED Notes (Signed)
PT states understanding of care given, follow up care. PT ambulated from ED to car with a steady gait.  

## 2017-11-07 DIAGNOSIS — Z0000 Encounter for general adult medical examination without abnormal findings: Secondary | ICD-10-CM | POA: Diagnosis not present

## 2017-11-07 DIAGNOSIS — Z87898 Personal history of other specified conditions: Secondary | ICD-10-CM | POA: Diagnosis not present

## 2017-11-07 DIAGNOSIS — R5382 Chronic fatigue, unspecified: Secondary | ICD-10-CM | POA: Diagnosis not present

## 2017-11-07 DIAGNOSIS — Z01419 Encounter for gynecological examination (general) (routine) without abnormal findings: Secondary | ICD-10-CM | POA: Diagnosis not present

## 2017-11-07 DIAGNOSIS — E538 Deficiency of other specified B group vitamins: Secondary | ICD-10-CM | POA: Diagnosis not present

## 2017-11-28 DIAGNOSIS — N938 Other specified abnormal uterine and vaginal bleeding: Secondary | ICD-10-CM | POA: Diagnosis not present

## 2017-11-28 DIAGNOSIS — R87810 Cervical high risk human papillomavirus (HPV) DNA test positive: Secondary | ICD-10-CM | POA: Diagnosis not present

## 2018-03-03 ENCOUNTER — Ambulatory Visit: Payer: BLUE CROSS/BLUE SHIELD | Admitting: Family

## 2018-05-05 ENCOUNTER — Ambulatory Visit (INDEPENDENT_AMBULATORY_CARE_PROVIDER_SITE_OTHER): Payer: Self-pay | Admitting: Family

## 2018-05-05 ENCOUNTER — Encounter: Payer: Self-pay | Admitting: Family

## 2018-05-05 VITALS — BP 116/74 | HR 59 | Temp 98.7°F | Ht 63.5 in | Wt 166.8 lb

## 2018-05-05 DIAGNOSIS — K219 Gastro-esophageal reflux disease without esophagitis: Secondary | ICD-10-CM

## 2018-05-05 DIAGNOSIS — N938 Other specified abnormal uterine and vaginal bleeding: Secondary | ICD-10-CM | POA: Insufficient documentation

## 2018-05-05 DIAGNOSIS — M542 Cervicalgia: Secondary | ICD-10-CM

## 2018-05-05 DIAGNOSIS — I341 Nonrheumatic mitral (valve) prolapse: Secondary | ICD-10-CM

## 2018-05-05 DIAGNOSIS — R0789 Other chest pain: Secondary | ICD-10-CM | POA: Insufficient documentation

## 2018-05-05 DIAGNOSIS — I340 Nonrheumatic mitral (valve) insufficiency: Secondary | ICD-10-CM | POA: Insufficient documentation

## 2018-05-05 NOTE — Patient Instructions (Addendum)
Prior to CT neck take a HOME PREGNANCY test  Suspect musculoskeletal etiology regarding her chest pain.  May continue Tylenol, moist heat.  Please stay very vigilant regarding this.  Any new or worsening symptoms, please go to nearest emergency room.  Today we discussed referrals, orders. Cardiology, CT neck   I have placed these orders in the system for you.  Please be sure to give [place] a call if you have not heard from our office regarding this. We should hear from [place] within ONE week with information regarding your appointment. If not, please let me know immediately.

## 2018-05-05 NOTE — Assessment & Plan Note (Signed)
Symptoms controlled on as needed Nexium.

## 2018-05-05 NOTE — Progress Notes (Signed)
Pre visit review using our clinic review tool, if applicable. No additional management support is needed unless otherwise documented below in the visit note. 

## 2018-05-05 NOTE — Assessment & Plan Note (Signed)
Remains short of breath on occasion.  This appears chronic for her.  Pending referral to cardiology to reestablish care.  Medical release form to request echocardiogram has been signed.  Will follow

## 2018-05-05 NOTE — Progress Notes (Signed)
Subjective:    Patient ID: Kiara Rich, female    DOB: Jun 13, 1985, 32 y.o.   MRN: [id number]  CC: Kiara Rich is a 32 y.o. female who presents today to establish care.    HPI: Complains of 'dry' throat, x one week, intermittent, unchanged.  Post nasal drip last week. Notes son had a 'cold' last week.   Describes sharp pain on left side of neck , randomly, last couple of seconds and then goes away.  'This happens when sleeps wrong at night.'   Left sided chest pain x 2 days ago, noticed in morning after waking up, better. Tyleonol with relieve. No worse with exertion.    No sob today.  No CP, left arm pain or numbness,  coughing, ear pain, sore throat, fever, unintentional weight loss, night sweats.   H/o of 'left tonsil and neck issues. '   GERD- taking nexium prn with resolve of belching.   MVP- last echo 2 years ago. Told a 'mild MVP. ' Occasional SOB, lightheaded. No syncope.   Had been seeing Dr Saddie Benders.   Dr Leonides Schanz - DUB improves with OCP.   H/o vertigo- last episode one year ago and given meclizine. Did epley's at home.   LMP last week  GI 2015- Dr Deatra Ina. Globus hystericus. Nexium   HISTORY:  Past Medical History:  Diagnosis Date  . Anxiety   . Frequent headaches   . GERD (gastroesophageal reflux disease)   . Hepatitis   . Mitral valve prolapse    Mild   Past Surgical History:  Procedure Laterality Date  . LEEP  2012   Precancerous cells   Family History  Problem Relation Age of Onset  . Uterine cancer Maternal Grandmother   . Diabetes Maternal Grandmother   . Diabetes Father   . Hearing loss Father   . Diabetes Mother   . Heart attack Mother   . Asthma Mother   . Heart disease Mother   . Hypertension Mother   . Kidney disease Mother   . Miscarriages / [place] Mother   . Uterine cancer Paternal Aunt   . Diabetes Paternal Uncle   . Diabetes Paternal Grandfather   . Diabetes Paternal Grandmother   . Diabetes Maternal Grandfather      Allergies: Penicillins Current Outpatient Medications on File Prior to Visit  Medication Sig Dispense Refill  . Esomeprazole Magnesium (NEXIUM 24HR PO) Take 2 tablets by mouth daily.     No current facility-administered medications on file prior to visit.     Social History   Tobacco Use  . Smoking status: Former Smoker    Types: Cigarettes    Last attempt to quit: 06/12/2011    Years since quitting: 6.9  . Smokeless tobacco: Never Used  Substance Use Topics  . Alcohol use: No  . Drug use: No    Review of Systems  Constitutional: Negative for chills and fever.  HENT: Positive for congestion. Negative for trouble swallowing and voice change.   Respiratory: Positive for shortness of breath. Negative for cough.   Cardiovascular: Positive for chest pain. Negative for palpitations.  Gastrointestinal: Negative for nausea and vomiting.      Objective:    BP 116/74   Pulse (!) 59   Temp 98.7 F (37.1 C) (Oral)   Ht 5' 3.5" (1.613 m)   Wt 166 lb 12.8 oz (75.7 kg)   LMP 04/28/2018 (Exact Date)   SpO2 99%   BMI 29.08 kg/m  BP Readings from  Last 3 Encounters:  05/05/18 116/74  02/14/17 105/68  10/25/15 105/62   Wt Readings from Last 3 Encounters:  05/05/18 166 lb 12.8 oz (75.7 kg)  10/25/15 158 lb (71.7 kg)  12/04/13 172 lb 6 oz (78.2 kg)    Physical Exam  Constitutional: She appears well-developed and well-nourished.  HENT:  Head: Normocephalic and atraumatic.  Right Ear: Hearing, tympanic membrane, external ear and ear canal normal. No drainage, swelling or tenderness. No foreign bodies. Tympanic membrane is not erythematous and not bulging. No middle ear effusion. No decreased hearing is noted.  Left Ear: Hearing, tympanic membrane, external ear and ear canal normal. No drainage, swelling or tenderness. No foreign bodies. Tympanic membrane is not erythematous and not bulging.  No middle ear effusion. No decreased hearing is noted.  Nose: Nose normal. No  rhinorrhea. Right sinus exhibits no maxillary sinus tenderness and no frontal sinus tenderness. Left sinus exhibits no maxillary sinus tenderness and no frontal sinus tenderness.  Mouth/Throat: Uvula is midline, oropharynx is clear and moist and mucous membranes are normal. No oropharyngeal exudate, posterior oropharyngeal edema, posterior oropharyngeal erythema or tonsillar abscesses.  Eyes: Conjunctivae are normal.  Neck: Normal range of motion and full passive range of motion without pain. Neck supple. No spinous process tenderness and no muscular tenderness present. Normal range of motion present. No thyroid mass and no thyromegaly present.    Area of pain as described by patient marked on diagram No lymphadenopathy, asymmetry, masses appreciated.   Cardiovascular: Regular rhythm, normal heart sounds and normal pulses.  Pulmonary/Chest: Effort normal and breath sounds normal. She has no wheezes. She has no rhonchi. She has no rales. She exhibits tenderness. She exhibits no mass.  Reproducible tenderness over left side on exam    Lymphadenopathy:       Head (right side): No submental, no submandibular, no tonsillar, no preauricular, no posterior auricular and no occipital adenopathy present.       Head (left side): No submental, no submandibular, no tonsillar, no preauricular, no posterior auricular and no occipital adenopathy present.    She has no cervical adenopathy.  Neurological: She is alert.  Skin: Skin is warm and dry.  Psychiatric: She has a normal mood and affect. Her speech is normal and behavior is normal. Thought content normal.  Vitals reviewed.      Assessment & Plan:   Problem List Items Addressed This Visit      Cardiovascular and Mediastinum   MVP (mitral valve prolapse)    Remains short of breath on occasion.  This appears chronic for her.  Pending referral to cardiology to reestablish care.  Medical release form to request echocardiogram has been signed.  Will  follow      Relevant Orders   Ambulatory referral to Cardiology     Digestive   Esophageal reflux    Symptoms controlled on as needed Nexium.        Other   Neck pain on left side - Primary    Benign exam.  Etiology is unclear at this time.  Strep is negative.  Pending CT soft tissue neck.   Patient will let me know how she is doing.      Relevant Orders   CT Soft Tissue Neck W Contrast   Chest wall pain    Reproducible.  Pain not exertional. EKG negative for acute ischemia.  ? GERD . When compared to prior EKG 2018, no significant changes.  Discussed with patient that I suspect musculoskeletal etiology.  She will stay vigilant and certainly let me know of any new or worsening symptoms.  We will follow-up in 3 months.      Relevant Orders   EKG 12-Lead (Completed)       I have discontinued Kiara [place]. Rich's norgestimate-ethinyl estradiol, ALPRAZolam, hyoscyamine, cephALEXin, and meclizine. I am also having her maintain her Esomeprazole Magnesium (NEXIUM 24HR PO).   No orders of the defined types were placed in this encounter.   Return precautions given.   Risks, benefits, and alternatives of the medications and treatment plan prescribed today were discussed, and patient expressed understanding.   Education regarding symptom management and diagnosis given to patient on AVS.  Continue to follow with Burnard Hawthorne, FNP for routine health maintenance.   Gennette Pac and I agreed with plan.   Mable Paris, FNP

## 2018-05-05 NOTE — Assessment & Plan Note (Addendum)
Reproducible.  Pain not exertional. EKG negative for acute ischemia.  ? GERD . When compared to prior EKG 2018, no significant changes.  Discussed with patient that I suspect musculoskeletal etiology.  She will stay vigilant and certainly let me know of any new or worsening symptoms.  We will follow-up in 3 months.

## 2018-05-05 NOTE — Assessment & Plan Note (Signed)
Benign exam.  Etiology is unclear at this time.  Strep is negative.  Pending CT soft tissue neck.   Patient will let me know how she is doing.

## 2018-05-23 ENCOUNTER — Ambulatory Visit: Admission: RE | Admit: 2018-05-23 | Payer: Managed Care, Other (non HMO) | Source: Ambulatory Visit

## 2018-05-27 ENCOUNTER — Telehealth: Payer: Self-pay

## 2018-05-27 NOTE — Telephone Encounter (Signed)
Copied from [place] [id number]. Topic: General - Inquiry >> May 27, 2018  1:48 PM Conception Chancy, NT wrote: Reason for CRM: Nira Conn is calling from [facility] and states that the patient is on the book for 05/30/18 and is needing a presert. She would like to speak to the person that handles this.   [phone]

## 2018-05-30 ENCOUNTER — Ambulatory Visit: Admission: RE | Admit: 2018-05-30 | Payer: Managed Care, Other (non HMO) | Source: Ambulatory Visit

## 2018-06-09 ENCOUNTER — Other Ambulatory Visit: Payer: Self-pay | Admitting: Family

## 2018-06-09 ENCOUNTER — Encounter: Payer: Self-pay | Admitting: Family

## 2018-06-09 DIAGNOSIS — M542 Cervicalgia: Secondary | ICD-10-CM

## 2018-06-20 ENCOUNTER — Ambulatory Visit
Admission: RE | Admit: 2018-06-20 | Discharge: 2018-06-20 | Disposition: A | Discharge status: Home / Self Care | Payer: Managed Care, Other (non HMO) | Source: Ambulatory Visit | Attending: Family | Admitting: Family

## 2018-06-20 DIAGNOSIS — M542 Cervicalgia: Secondary | ICD-10-CM | POA: Insufficient documentation

## 2018-08-04 NOTE — Progress Notes (Signed)
Cardiology Office Note  Date:  08/05/2018   ID:  Gennette Pac, DOB: [birth year], MRN: [id number]  PCP:  Burnard Hawthorne, FNP   Chief Complaint  Patient presents with  . other    MVP c/o sob and dizziness. Meds reviewed verbally with pt.    HPI:  Kiara Rich is a 33 y.o. female with a PMHx of: Mitral Valve Prolapse (MVP) GERD Her mother has a history of heart disease, MI and HTN. She was referred by Burnard Hawthorne, FNP for consultation of her MVP, sharp left chest pain, dizziness  INTERVAL HISTORY: The patient reports today for an initial visit of her asymptomatic MVP. Her last echo was 2 years ago. Told a "mild MVP." in 2014. She endorses occasional SOB, occasional sharp left chest pain, periodic dizziness. She is not sure if her SOB is from her anxiety.  occasional sharp pains  are very rare. She used to smoke many years ago.   The patient reports episodes of lightheadedness that occur randomly, when she will turn her head. She also experiences palpitations at night when in bed.  Blood pressure typically runs low at baseline  She has a family history of Type II DM. She does not have diabetes  She denies syncope or near syncope  Prior echocardiogram unavailable for review, done in the office of Dr. Lavera Guise.  No report available  Today's Blood pressure 110/73 Total Chol 153/ LDL 72 CR 0.73 Glucose 97  EKG personally reviewed by myself on todays visit Shows normal rhythm. 67 bpm.    OTHER PAST MEDICAL HISTORY REVIEWED BY ME FOR TODAY'S VISIT:   PMH:   has a past medical history of Anxiety, Frequent headaches, GERD (gastroesophageal reflux disease), Hepatitis, and Mitral valve prolapse.  PSH:    Past Surgical History:  Procedure Laterality Date  . LEEP  2012   Precancerous cells    Current Outpatient Medications  Medication Sig Dispense Refill  . pantoprazole (PROTONIX) 40 MG tablet Take 40 mg by mouth daily.     No current facility-administered  medications for this visit.      ALLERGIES:   Penicillins   SOCIAL HISTORY:  The patient  reports that she quit smoking about 7 years ago. Her smoking use included cigarettes. She has never used smokeless tobacco. She reports that she does not drink alcohol or use drugs.   FAMILY HISTORY:   family history includes Asthma in her mother; Diabetes in her father, maternal grandfather, maternal grandmother, mother, paternal grandfather, paternal grandmother, and paternal uncle; Hearing loss in her father; Heart attack in her mother; Heart disease in her mother; Hypertension in her mother; Kidney disease in her mother; Miscarriages / Stillbirths in her mother; Uterine cancer in her maternal grandmother and paternal aunt.    REVIEW OF SYSTEMS: Review of Systems  Constitutional: Negative.   Eyes: Negative.   Respiratory: Positive for shortness of breath.   Cardiovascular: Positive for palpitations.  Gastrointestinal: Negative.   Genitourinary: Negative.   Musculoskeletal: Negative.   Neurological: Positive for dizziness. Negative for loss of consciousness.  Psychiatric/Behavioral: The patient is nervous/anxious.   All other systems reviewed and are negative.    PHYSICAL EXAM: VS:  BP 102/69 (BP Location: Right Arm, Patient Position: Sitting, Cuff Size: Normal)   Pulse 67   Ht 5' 3.5" (1.613 m)   Wt 169 lb 4 oz (76.8 kg)   BMI 29.51 kg/m  , BMI Body mass index is 29.51 kg/m.  GEN: Well nourished, well  developed, in no acute distress HEENT: normal Neck: no JVD, carotid bruits, or masses Cardiac: RRR; no murmurs, rubs, or gallops,no edema  Respiratory:  clear to auscultation bilaterally, normal work of breathing GI: soft, nontender, nondistended, + BS MS: no deformity or atrophy Skin: warm and dry, no rash Neuro:  Strength and sensation are intact Psych: euthymic mood, full affect  RECENT LABS: No results found for requested labs within last 8760 hours.    LIPID PANEL: Lab  Results  Component Value Date   CHOL 153 12/02/2012   HDL 60 12/02/2012   LDLCALC 72 12/02/2012   TRIG 104 12/02/2012      WEIGHT: Wt Readings from Last 3 Encounters:  08/05/18 169 lb 4 oz (76.8 kg)  05/05/18 166 lb 12.8 oz (75.7 kg)  10/25/15 158 lb (71.7 kg)       ASSESSMENT AND PLAN:  MVP (mitral valve prolapse)  Plan: EKG 12-Lead No significant murmur appreciated on exam indicating no significant mitral valve regurgitation, able to exclude mild regurg Likely of little clinical significance.  She does not need additional work-up at this time Discussed with her in detail, repeat echocardiogram could be performed if she chooses She is wanting to hold off at this time Continue to monitor.  Dizziness, related to low BP Plan: Recommend wearing compression socks Staying hydrated. Do not avoid salt Blood pressure running low at baseline Dizziness with moving her head is likely unrelated to cardiac etiology or arrhythmia  Left sharp chest pain Likely musculoskeletal, she has few risk factors for coronary disease  Low blood pressure Recommended hydration, do not avoid salt Compression hose Rise from a supine position slowly   Disposition:   F/U  PRN  Total encounter time more than 60 minutes. Greater than 50% was spent in counseling and coordination of care with the patient.   Orders Placed This Encounter  Procedures  . EKG 12-Lead    .Margit Banda  Is acting as a Education administrator for Ida Rogue, M.D., Ph.D.   I have reviewed the above documentation for accuracy and completeness, and I agree with the above.   Signed, Esmond Plants, M.D., Ph.D. 08/05/2018  [place], [place]

## 2018-08-05 ENCOUNTER — Ambulatory Visit (INDEPENDENT_AMBULATORY_CARE_PROVIDER_SITE_OTHER): Payer: Managed Care, Other (non HMO) | Admitting: Cardiovascular Disease

## 2018-08-05 ENCOUNTER — Encounter: Payer: Self-pay | Admitting: Cardiovascular Disease

## 2018-08-05 VITALS — BP 102/69 | HR 67 | Ht 63.5 in | Wt 169.2 lb

## 2018-08-05 DIAGNOSIS — I341 Nonrheumatic mitral (valve) prolapse: Secondary | ICD-10-CM | POA: Diagnosis not present

## 2018-08-05 NOTE — Patient Instructions (Signed)

## 2018-08-06 ENCOUNTER — Encounter: Payer: Self-pay | Admitting: Family

## 2018-08-06 ENCOUNTER — Ambulatory Visit (INDEPENDENT_AMBULATORY_CARE_PROVIDER_SITE_OTHER): Payer: Managed Care, Other (non HMO) | Admitting: Family

## 2018-08-06 VITALS — BP 96/68 | HR 67 | Temp 98.8°F | Wt 167.0 lb

## 2018-08-06 DIAGNOSIS — R200 Anesthesia of skin: Secondary | ICD-10-CM

## 2018-08-06 DIAGNOSIS — R202 Paresthesia of skin: Secondary | ICD-10-CM | POA: Diagnosis not present

## 2018-08-06 DIAGNOSIS — K219 Gastro-esophageal reflux disease without esophagitis: Secondary | ICD-10-CM | POA: Diagnosis not present

## 2018-08-06 DIAGNOSIS — M542 Cervicalgia: Secondary | ICD-10-CM

## 2018-08-06 MED ORDER — MELOXICAM 7.5 MG PO TABS: 7.5000 mg | ORAL_TABLET | Freq: Every day | ORAL | 1 refills | Status: DC

## 2018-08-06 MED ORDER — CYCLOBENZAPRINE HCL 5 MG PO TABS: 5.0000 mg | ORAL_TABLET | Freq: Every evening | ORAL | 1 refills | Status: DC | PRN

## 2018-08-06 NOTE — Assessment & Plan Note (Addendum)
Appears particularly exacerbated by flexion at the elbow.  Suspect impingement of radial, ulnar nerve.  agreed conservative therapy reasonable to start.  If no improvement, will consult orthopedics

## 2018-08-06 NOTE — Assessment & Plan Note (Signed)
Recently started using Protonix again, discussed long-term risk of PPIs.  Advised patient to try to wean off medication and focus on lifestyle modifications.  She verbalized understanding

## 2018-08-06 NOTE — Assessment & Plan Note (Signed)
Long discussion with patient and suspect musculoskeletal etiology, particularly with her line of work.  Jointly agreed that conservative therapy with meloxicam, PRN Flexeril is appropriate.  We will also pursue physical therapy.  She will return to clinic in a couple months, if no improvement will consult orthopedics.

## 2018-08-06 NOTE — Patient Instructions (Addendum)
Suspect musculoskeletal pain as we discussed today.  We can try meloxicam which is an anti-inflammatory.  Please take with food.  Please do not take any other anti-inflammatories including ibuprofen, Motrin, Advil etc. You may also use Flexeril which the muscle relaxant from time to time.  If no improvement after a month or 2, please return for follow-up so we can discuss a referral to orthopedics.  Wean off of protonix when you can. Use for flare ups going forward   Long term use beyond 3 months of proton pump inhibitors , also called PPI's, is associated with malabsorption of vitamins, chronic kidney disease, fracture risk, and diarrheal illnesses. PPI's include Nexium, Prilosec, Protonix, Dexilant, and Prevacid.   I generally recommend trying to control acid reflux with lifestyle modifications including avoiding trigger foods, not eating 2 hours prior to bedtime. You may use histamine 2 blockers daily to twice daily ( this is Zantac, Pepcid) and then when symptoms flare, start back on PPI for short course.   Of note, we will need to do an endoscopy ( upper GI) to evaluate your esophagus, stomach in the future if acid reflux persists are you develop red flag symptoms: trouble swallowing, hoarseness, chronic cough, unexplained weight loss.   Cervical Strain and Sprain Rehab Ask your health care provider which exercises are safe for you. Do exercises exactly as told by your health care provider and adjust them as directed. It is normal to feel mild stretching, pulling, tightness, or discomfort as you do these exercises, but you should stop right away if you feel sudden pain or your pain gets worse.Do not begin these exercises until told by your health care provider. Stretching and range of motion exercises These exercises warm up your muscles and joints and improve the movement and flexibility of your neck. These exercises also help to relieve pain, numbness, and tingling. Exercise A: Cervical side  bend  1. Using good posture, sit on a stable chair or stand up. 2. Without moving your shoulders, slowly tilt your left / right ear to your shoulder until you feel a stretch in your neck muscles. You should be looking straight ahead. 3. Hold for __________ seconds. 4. Repeat with the other side of your neck. Repeat __________ times. Complete this exercise __________ times a day. Exercise B: Cervical rotation  1. Using good posture, sit on a stable chair or stand up. 2. Slowly turn your head to the side as if you are looking over your left / right shoulder. ? Keep your eyes level with the ground. ? Stop when you feel a stretch along the side and the back of your neck. 3. Hold for __________ seconds. 4. Repeat this by turning to your other side. Repeat __________ times. Complete this exercise __________ times a day. Exercise C: Thoracic extension and pectoral stretch 1. Roll a towel or a small blanket so it is about 4 inches (10 cm) in diameter. 2. Lie down on your back on a firm surface. 3. Put the towel lengthwise, under your spine in the middle of your back. It should not be not under your shoulder blades. The towel should line up with your spine from your middle back to your lower back. 4. Put your hands behind your head and let your elbows fall out to your sides. 5. Hold for __________ seconds. Repeat __________ times. Complete this exercise __________ times a day. Strengthening exercises These exercises build strength and endurance in your neck. Endurance is the ability to use your  muscles for a long time, even after your muscles get tired. Exercise D: Upper cervical flexion, isometric 1. Lie on your back with a thin pillow behind your head and a small rolled-up towel under your neck. 2. Gently tuck your chin toward your chest and nod your head down to look toward your feet. Do not lift your head off the pillow. 3. Hold for __________ seconds. 4. Release the tension slowly. Relax  your neck muscles completely before you repeat this exercise. Repeat __________ times. Complete this exercise __________ times a day. Exercise E: Cervical extension, isometric  1. Stand about 6 inches (15 cm) away from a wall, with your back facing the wall. 2. Place a soft object, about 6-8 inches (15-20 cm) in diameter, between the back of your head and the wall. A soft object could be a small pillow, a ball, or a folded towel. 3. Gently tilt your head back and press into the soft object. Keep your jaw and forehead relaxed. 4. Hold for __________ seconds. 5. Release the tension slowly. Relax your neck muscles completely before you repeat this exercise. Repeat __________ times. Complete this exercise __________ times a day. Posture and body mechanics Body mechanics refers to the movements and positions of your body while you do your daily activities. Posture is part of body mechanics. Good posture and healthy body mechanics can help to relieve stress in your body's tissues and joints. Good posture means that your spine is in its natural S-curve position (your spine is neutral), your shoulders are pulled back slightly, and your head is not tipped forward. The following are general guidelines for applying improved posture and body mechanics to your everyday activities. Standing   When standing, keep your spine neutral and keep your feet about hip-width apart. Keep a slight bend in your knees. Your ears, shoulders, and hips should line up.  When you do a task in which you stand in one place for a long time, place one foot up on a stable object that is 2-4 inches (5-10 cm) high, such as a footstool. This helps keep your spine neutral. Sitting   When sitting, keep your spine neutral and your keep feet flat on the floor. Use a footrest, if necessary, and keep your thighs parallel to the floor. Avoid rounding your shoulders, and avoid tilting your head forward.  When working at a desk or a computer,  keep your desk at a height where your hands are slightly lower than your elbows. Slide your chair under your desk so you are close enough to maintain good posture.  When working at a computer, place your monitor at a height where you are looking straight ahead and you do not have to tilt your head forward or downward to look at the screen. Resting When lying down and resting, avoid positions that are most painful for you. Try to support your neck in a neutral position. You can use a contour pillow or a small rolled-up towel. Your pillow should support your neck but not push on it. This information is not intended to replace advice given to you by your health care provider. Make sure you discuss any questions you have with your health care provider. Document Released: 05/28/2005 Document Revised: 02/02/2016 Document Reviewed: 05/04/2015 Elsevier Interactive Patient Education  2019 [employer].

## 2018-08-06 NOTE — Progress Notes (Signed)
Subjective:    Patient ID: Kiara Rich, female    DOB: 22-Jul-1985, 33 y.o.   MRN: [id number]  CC: Kiara Rich is a 33 y.o. female who presents today for follow up.   HPI: Continues to have neck pain, left lateral for past year, unchanged.   Carrying heavy pocket book will make worse. Describes as ache.  Has numbness in bilateral hands 6 years ago, returned in past few weeks.  particularly when washing hair, holding phone with elbow flexed. Worse in left hand. Starts in left shoulder when sleeping on left side. All fingers. No vision changes, severe HA, UE weakness.   hasnt tried medication for this.  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   GERD- recently had epigastric burning. on protonix since last week. No trouble swallowing, painful swallow. No h/o GIB.   Former smoker.       US soft tissue- unremarkable.   HISTORY:  Past Medical History:  Diagnosis Date  . Anxiety   . Frequent headaches   . GERD (gastroesophageal reflux disease)   . Hepatitis   . Mitral valve prolapse    Mild   Past Surgical History:  Procedure Laterality Date  . LEEP  2012   Precancerous cells   Family History  Problem Relation Age of Onset  . Uterine cancer Maternal Grandmother   . Diabetes Maternal Grandmother   . Diabetes Father   . Hearing loss Father   . Diabetes Mother   . Heart attack Mother   . Asthma Mother   . Heart disease Mother   . Hypertension Mother   . Kidney disease Mother   . Miscarriages / [place] Mother   . Uterine cancer Paternal Aunt   . Diabetes Paternal Uncle   . Diabetes Paternal Grandfather   . Diabetes Paternal Grandmother   . Diabetes Maternal Grandfather     Allergies: Penicillins Current Outpatient Medications on File Prior to Visit  Medication Sig Dispense Refill  . pantoprazole (PROTONIX) 40 MG tablet Take 40 mg by mouth daily.     No current facility-administered medications on file prior to visit.     Social History   Tobacco Use  . Smoking  status: Former Smoker    Types: Cigarettes    Last attempt to quit: 06/12/2011    Years since quitting: 7.1  . Smokeless tobacco: Never Used  Substance Use Topics  . Alcohol use: No  . Drug use: No    Review of Systems  Constitutional: Negative for chills and fever.  Eyes: Negative for visual disturbance.  Respiratory: Negative for cough.   Cardiovascular: Negative for chest pain and palpitations.  Gastrointestinal: Negative for nausea and vomiting.  Musculoskeletal: Positive for arthralgias, neck pain and neck stiffness.  Neurological: Positive for numbness. Negative for dizziness and headaches.      Objective:    BP 96/68 (BP Location: Left Arm, Patient Position: Sitting, Cuff Size: Large)   Pulse 67   Temp 98.8 F (37.1 C)   Wt 167 lb (75.8 kg)   SpO2 97%   BMI 29.12 kg/m  BP Readings from Last 3 Encounters:  08/06/18 96/68  08/05/18 102/69  05/05/18 116/74   Wt Readings from Last 3 Encounters:  08/06/18 167 lb (75.8 kg)  08/05/18 169 lb 4 oz (76.8 kg)  05/05/18 166 lb 12.8 oz (75.7 kg)    Physical Exam Vitals signs reviewed.  Constitutional:      Appearance: She is well-developed.  Eyes:     Conjunctiva/sclera: Conjunctivae  normal.  Neck:     Musculoskeletal: Full passive range of motion without pain and normal range of motion. Normal range of motion. Spinous process tenderness and muscular tenderness present. No edema, erythema or pain with movement.     Comments: Negative Spurling's test Cardiovascular:     Rate and Rhythm: Normal rate and regular rhythm.     Pulses: Normal pulses.     Heart sounds: Normal heart sounds.  Pulmonary:     Effort: Pulmonary effort is normal.     Breath sounds: Normal breath sounds. No wheezing, rhonchi or rales.  Musculoskeletal:     Right wrist: She exhibits normal range of motion, no tenderness, no swelling and no effusion.     Left wrist: She exhibits normal range of motion, no tenderness, no swelling and no effusion.      Comments: Negative Tinel, Phalen's bilaterally.  Palpable radial pulses.  Strength  5 out of 5. Sensation intact BUE.   Lymphadenopathy:     Cervical: No cervical adenopathy.  Skin:    General: Skin is warm and dry.  Neurological:     Mental Status: She is alert.  Psychiatric:        Speech: Speech normal.        Behavior: Behavior normal.        Thought Content: Thought content normal.        Assessment & Plan:   Problem List Items Addressed This Visit      Digestive   Esophageal reflux    Recently started using Protonix again, discussed long-term risk of PPIs.  Advised patient to try to wean off medication and focus on lifestyle modifications.  She verbalized understanding        Other   Neck pain on left side    Long discussion with patient and suspect musculoskeletal etiology, particularly with her line of work.  Jointly agreed that conservative therapy with meloxicam, PRN Flexeril is appropriate.  We will also pursue physical therapy.  She will return to clinic in a couple months, if no improvement will consult orthopedics.      Relevant Medications   cyclobenzaprine (FLEXERIL) 5 MG tablet   meloxicam (MOBIC) 7.5 MG tablet   Numbness and tingling in both hands    Appears particularly exacerbated by flexion at the elbow.  Suspect impingement of radial, ulnar nerve.  agreed conservative therapy reasonable to start.  If no improvement, will consult orthopedics       Other Visit Diagnoses    Neck pain    -  Primary   Relevant Medications   cyclobenzaprine (FLEXERIL) 5 MG tablet   meloxicam (MOBIC) 7.5 MG tablet   Other Relevant Orders   Ambulatory referral to Physical Therapy       I am having Lattie Haw R. Cornette start on cyclobenzaprine and meloxicam. I am also having her maintain her pantoprazole.   Meds ordered this encounter  Medications  . cyclobenzaprine (FLEXERIL) 5 MG tablet    Sig: Take 1 tablet (5 mg total) by mouth at bedtime as needed for muscle  spasms.    Dispense:  30 tablet    Refill:  1    Order Specific Question:   Supervising Provider    Answer:   Deborra Medina L [[id number]]  . meloxicam (MOBIC) 7.5 MG tablet    Sig: Take 1 tablet (7.5 mg total) by mouth daily.    Dispense:  30 tablet    Refill:  1    Order Specific Question:  Supervising Provider    Answer:   Crecencio Mc [[id number]]    Return precautions given.   Risks, benefits, and alternatives of the medications and treatment plan prescribed today were discussed, and patient expressed understanding.   Education regarding symptom management and diagnosis given to patient on AVS.  Continue to follow with Burnard Hawthorne, FNP for routine health maintenance.   Gennette Pac and I agreed with plan.   Mable Paris, FNP

## 2018-08-07 ENCOUNTER — Other Ambulatory Visit: Payer: Managed Care, Other (non HMO)

## 2018-11-05 ENCOUNTER — Ambulatory Visit: Payer: Managed Care, Other (non HMO) | Admitting: Family

## 2019-02-24 ENCOUNTER — Encounter: Payer: Self-pay | Admitting: Family

## 2019-03-06 ENCOUNTER — Other Ambulatory Visit: Payer: Self-pay

## 2019-03-06 ENCOUNTER — Ambulatory Visit (INDEPENDENT_AMBULATORY_CARE_PROVIDER_SITE_OTHER): Payer: Managed Care, Other (non HMO) | Admitting: Family Medicine

## 2019-03-06 ENCOUNTER — Encounter: Payer: Self-pay | Admitting: Family Medicine

## 2019-03-06 DIAGNOSIS — Z8639 Personal history of other endocrine, nutritional and metabolic disease: Secondary | ICD-10-CM

## 2019-03-06 DIAGNOSIS — Z1329 Encounter for screening for other suspected endocrine disorder: Secondary | ICD-10-CM | POA: Diagnosis not present

## 2019-03-06 DIAGNOSIS — R1013 Epigastric pain: Secondary | ICD-10-CM | POA: Diagnosis not present

## 2019-03-06 DIAGNOSIS — J302 Other seasonal allergic rhinitis: Secondary | ICD-10-CM

## 2019-03-06 DIAGNOSIS — Z13220 Encounter for screening for lipoid disorders: Secondary | ICD-10-CM

## 2019-03-06 NOTE — Progress Notes (Signed)
Patient ID: Kiara Rich, female   DOB: [birth year], 33 y.o.   MRN: [id number]    Virtual Visit via video Note  This visit type was conducted due to national recommendations for restrictions regarding the COVID-19 pandemic (e.g. social distancing).  This format is felt to be most appropriate for this patient at this time.  All issues noted in this document were discussed and addressed.  No physical exam was performed (except for noted visual exam findings with Video Visits).   I connected with Kiara Rich today at 10:40 AM EDT by a video enabled telemedicine application and verified that I am speaking with the correct person using two identifiers. Location patient: home Location provider: work or home office Persons participating in the virtual visit: patient, provider  I discussed the limitations, risks, security and privacy concerns of performing an evaluation and management service by video and the availability of in person appointments. I also discussed with the patient that there may be a patient responsible charge related to this service. The patient expressed understanding and agreed to proceed.   HPI:  Patient and I connected via video to discuss multiple issues.  First is some upper abdominal pain at times.  Feels like acid reflux.  Will take a week or 2 of omeprazole or protonix & usually goes away.  Patient read online she may have H. pylori, became concerned.  No fever or chills.  Denies vomiting.  Nausea at times, but this usually goes away with medication.  No severe sharp abdominal pain.  Some belching.  Patient also wants to follow-up on B12 levels and vitamin D levels, states she has been low in the past.  Has never been screened for cholesterol levels, wants to follow-up on this & have lipid levels checked. Also requests thyroid panel.  Patient also feels seasonal allergies are getting worse over the years.  Will have more congestion in nose and drainage down back of  throat.  Never has struggled as much with allergies in the past.  Also wonders if she is allergic to the flu vaccine.  States that she gets a flu vaccine, it will cause her ears to feel full and she will get some congestion.  ROS: See pertinent positives and negatives per HPI.  Past Medical History:  Diagnosis Date  . Anxiety   . Frequent headaches   . GERD (gastroesophageal reflux disease)   . Hepatitis   . Mitral valve prolapse    Mild    Past Surgical History:  Procedure Laterality Date  . LEEP  2012   Precancerous cells    Family History  Problem Relation Age of Onset  . Uterine cancer Maternal Grandmother   . Diabetes Maternal Grandmother   . Diabetes Father   . Hearing loss Father   . Diabetes Mother   . Heart attack Mother   . Asthma Mother   . Heart disease Mother   . Hypertension Mother   . Kidney disease Mother   . Miscarriages / [place] Mother   . Uterine cancer Paternal Aunt   . Diabetes Paternal Uncle   . Diabetes Paternal Grandfather   . Diabetes Paternal Grandmother   . Diabetes Maternal Grandfather    Social History   Tobacco Use  . Smoking status: Former Smoker    Types: Cigarettes    Quit date: 06/12/2011    Years since quitting: 7.7  . Smokeless tobacco: Never Used  Substance Use Topics  . Alcohol use: No  Current Outpatient Medications:  .  cyclobenzaprine (FLEXERIL) 5 MG tablet, Take 1 tablet (5 mg total) by mouth at bedtime as needed for muscle spasms., Disp: 30 tablet, Rfl: 1 .  meloxicam (MOBIC) 7.5 MG tablet, Take 1 tablet (7.5 mg total) by mouth daily., Disp: 30 tablet, Rfl: 1 .  pantoprazole (PROTONIX) 40 MG tablet, Take 40 mg by mouth daily., Disp: , Rfl:   EXAM:  GENERAL: alert, oriented, appears well and in no acute distress  HEENT: atraumatic, conjunttiva clear, no obvious abnormalities on inspection of external nose and ears  NECK: normal movements of the head and neck  LUNGS: on inspection no signs of respiratory  distress, breathing rate appears normal, no obvious gross SOB, gasping or wheezing  CV: no obvious cyanosis  MS: moves all visible extremities without noticeable abnormality  PSYCH/NEURO: pleasant and cooperative, no obvious depression or anxiety, speech and thought processing grossly intact  ASSESSMENT AND PLAN:  Discussed the following assessment and plan:   Patient comes in the clinic for lab work including lipid screening, thyroid screening and follow-up on vitamin D and B12 levels.  We will also check H. pylori antibodies due to patient's concerns.  Epigastric abdominal pain she can use a medication like omeprazole or Protonix to form the acid production and see if this helps reduce her symptoms.  We can also consider referral to GI in the future.  Discussed with patient that I do not feel the symptoms of getting some fullness in ears and nasal congestion has an allergy to flu vaccine.  Advised patient happy to refer her to allergy for further testing.  Advised to take a medication like Zyrtec or Claritin consistently and use of Flonase nasal spray consistently especially during transitional seasons like spring and autumn to help combat symptoms.  1. Lipid screening  - Lipid panel; Future  2. Epigastric abdominal pain  - HELICOBACTER PYLORI  ANTIBODY, IGM; Future - Helicobacter pylori abs-IgG+IgA, bld; Future - Comp Met (CMET); Future - Amylase; Future - Lipase; Future  3. History of non anemic vitamin B12 deficiency  - CBC w/Diff; Future - B12 and Folate Panel; Future - VITAMIN D 25 Hydroxy (Vit-D Deficiency, Fractures); Future  4. Thyroid disorder screening  - TSH; Future  5. Seasonal allergies  - Ambulatory referral to Allergy    I discussed the assessment and treatment plan with the patient. The patient was provided an opportunity to ask questions and all were answered. The patient agreed with the plan and demonstrated an understanding of the instructions.   The  patient was advised to call back or seek an in-person evaluation if the symptoms worsen or if the condition fails to improve as anticipated.  I provided 25 minutes of video-face-to-face time during this encounter.   Jodelle Green, FNP

## 2019-03-09 NOTE — Telephone Encounter (Signed)
Note in my chart 

## 2019-03-26 ENCOUNTER — Encounter: Payer: Self-pay | Admitting: Family Medicine

## 2019-03-27 ENCOUNTER — Encounter: Payer: Self-pay | Admitting: Family

## 2019-03-27 NOTE — Telephone Encounter (Signed)
Kiara Rich,   can you please help me address this.  Patient has no medical diagnoses that would contraindicate her getting a flu vaccine as required by her employer.  She is requesting we fill out a contraindication  form to give to her employer; I have not yet seen this form but I am assuming it is asking for some sort of medical diagnosis that would be a contraindication for getting the flu vaccine.  Patient states she does not like to get the flu vaccine because she knows her body and it causes her to become ill.  I am not comfortable signing this contraindication form based on patient report of becoming ill from flu vaccine with no other medical diagnoses that would serve as contraindication for getting the vaccine.  I have not yet seen the form she is requesting we fill out, so I cannot be 100% sure what is asking.

## 2019-03-30 NOTE — Telephone Encounter (Signed)
Thank you Juliann Pulse  I also did give her a referral to allergist as she requested at a previous visit  At this time all I can do is go off of her documented medical history -- and she does not meet criteria specified by form to be exempt from the vaccine. Dr Derrel Nip also agreed we cannot sign form.   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

## 2019-03-30 NOTE — Telephone Encounter (Signed)
Called and spoke with patient and advised per NP note could not sign this form due to patient does not meet the criteria for Medical contraindication to flu shot. Patient became very upset and stated her job depends on this and that if she takes the flu shot and something happens she will take legal action on this provider. Patient would I consult with another provider and see if they would sign I consulted with NP supervising provider Mable Paris and she stated she could not sign that tylenol headache is not a contraindication to flu- shot. Patient stated she would find a provider that will listen to her she knows her body better than anyone and she would not have a doctor telling her what she can and cannot do concerning her body. Patient advised we would here from this.

## 2019-03-30 NOTE — Telephone Encounter (Signed)
Kiara Rich,  I have reviewed the form patient would like [place] to fill out so she does not have to get the influenza vaccine.  However, she does not meet any of the criteria that would constitute a medical contraindication so I cannot sign this form.  I have tried to explain this to patient, but she has become upset and did threaten to go elsewhere in a MyChart message.  Advised patient that a medical contraindication would mean she has a documented severe ingredient allergy, documented severe previous allergic reaction,  health condition diagnosis that would prevent her from getting it or past history of Guillain-Barr syndrome-she has none of these.  I cannot sign this form that would contraindicate her getting the flu vaccine

## 2019-04-01 ENCOUNTER — Encounter: Payer: Self-pay | Admitting: Family

## 2019-04-09 ENCOUNTER — Encounter: Payer: Self-pay | Admitting: Family

## 2019-04-10 ENCOUNTER — Encounter: Payer: Self-pay | Admitting: Family

## 2019-04-16 ENCOUNTER — Other Ambulatory Visit: Payer: Self-pay

## 2019-04-16 DIAGNOSIS — Z20822 Contact with and (suspected) exposure to covid-19: Secondary | ICD-10-CM

## 2019-07-01 ENCOUNTER — Encounter: Payer: Self-pay | Admitting: Family

## 2019-07-01 ENCOUNTER — Other Ambulatory Visit: Payer: Self-pay | Admitting: Family

## 2019-07-01 DIAGNOSIS — J329 Chronic sinusitis, unspecified: Secondary | ICD-10-CM

## 2019-07-21 DIAGNOSIS — R42 Dizziness and giddiness: Secondary | ICD-10-CM | POA: Insufficient documentation

## 2019-07-21 DIAGNOSIS — J3501 Chronic tonsillitis: Secondary | ICD-10-CM | POA: Insufficient documentation

## 2019-07-29 ENCOUNTER — Encounter: Payer: Self-pay | Admitting: Family

## 2019-07-29 ENCOUNTER — Telehealth: Payer: Self-pay | Admitting: Family

## 2019-07-29 NOTE — Telephone Encounter (Signed)
Called pt for weather prevention for Friday 08/07/19 to make appt virtual. Pt stated she did not want a virtual b/c Joycelyn Schmid could not feel a lymph node. She wanted an appt for next week but I told her she was completely full. She then said just cancel my appt. This makes no sense that I have been going back and forth about this and can't get an in office appt. I explained we are just trying to be prepared for the weather and tried to encourage the virtual but she just said cancel it and hung up.

## 2019-07-30 NOTE — Telephone Encounter (Signed)
Noted Pt has appt , in person on 08/14/19

## 2019-07-31 ENCOUNTER — Ambulatory Visit: Payer: Managed Care, Other (non HMO) | Admitting: Family

## 2019-08-14 ENCOUNTER — Ambulatory Visit (INDEPENDENT_AMBULATORY_CARE_PROVIDER_SITE_OTHER): Payer: Managed Care, Other (non HMO) | Admitting: Family

## 2019-08-14 ENCOUNTER — Other Ambulatory Visit: Payer: Self-pay

## 2019-08-14 ENCOUNTER — Encounter: Payer: Self-pay | Admitting: Family

## 2019-08-14 ENCOUNTER — Ambulatory Visit (INDEPENDENT_AMBULATORY_CARE_PROVIDER_SITE_OTHER): Payer: Managed Care, Other (non HMO)

## 2019-08-14 VITALS — BP 100/62 | HR 92 | Temp 97.9°F | Ht 64.5 in | Wt 176.6 lb

## 2019-08-14 DIAGNOSIS — K219 Gastro-esophageal reflux disease without esophagitis: Secondary | ICD-10-CM

## 2019-08-14 DIAGNOSIS — R590 Localized enlarged lymph nodes: Secondary | ICD-10-CM

## 2019-08-14 DIAGNOSIS — M542 Cervicalgia: Secondary | ICD-10-CM

## 2019-08-14 DIAGNOSIS — Z8639 Personal history of other endocrine, nutritional and metabolic disease: Secondary | ICD-10-CM | POA: Diagnosis not present

## 2019-08-14 LAB — CBC WITH DIFFERENTIAL/PLATELET
Basophils Absolute: 0 10*3/uL (ref 0.0–0.1)
Basophils Relative: 0.3 % (ref 0.0–3.0)
Eosinophils Absolute: 0.1 10*3/uL (ref 0.0–0.7)
Eosinophils Relative: 1.2 % (ref 0.0–5.0)
HCT: 38.7 % (ref 36.0–46.0)
Hemoglobin: 13.2 g/dL (ref 12.0–15.0)
Lymphocytes Relative: 25.2 % (ref 12.0–46.0)
Lymphs Abs: 1.9 10*3/uL (ref 0.7–4.0)
MCHC: 34.1 g/dL (ref 30.0–36.0)
MCV: 89.8 fl (ref 78.0–100.0)
Monocytes Absolute: 0.5 10*3/uL (ref 0.1–1.0)
Monocytes Relative: 6 % (ref 3.0–12.0)
Neutro Abs: 5.2 10*3/uL (ref 1.4–7.7)
Neutrophils Relative %: 67.3 % (ref 43.0–77.0)
Platelets: 247 10*3/uL (ref 150.0–400.0)
RBC: 4.31 Mil/uL (ref 3.87–5.11)
RDW: 12.7 % (ref 11.5–15.5)
WBC: 7.7 10*3/uL (ref 4.0–10.5)

## 2019-08-14 LAB — VITAMIN D 25 HYDROXY (VIT D DEFICIENCY, FRACTURES): VITD: 27.88 ng/mL — ABNORMAL LOW (ref 30.00–100.00)

## 2019-08-14 NOTE — Progress Notes (Signed)
Subjective:    Patient ID: Kiara Rich, female    DOB: [birth year], 34 y.o.   MRN: [id number]  CC: Kiara Rich is a 33 y.o. female who presents today for an acute visit.    HPI: Chief complaint of left-sided enlarged lymph nodes  Over left clavicle, x 5 months. Unchanged to improved. First noticed the lymph  after gotten flu shot 5 months ago.  Feels like a 'ball' and comes over collar bone.  Comes and goes and sometimes hard to find Nontender.  She also endores left sided neck pain as well as posterior neck pain.  No pain in clavicle area. Sleeps on left side.  Has noted while doing weightlifting but also in her left shoulder she hears a "cracking" sound.  It is not painful.  No cough, wheezing, fever, trouble swallowing, unusual weight loss.   Works in [employer]. No numbness, weakness of her extremity.   Follows with ENT for tonsillar stones, vertigo. Recently given clindamycin for tonsilar stones, which thinks may have been helpful.    H/o gerd.  She also feels "irritation' in her throat.  No dysphagia, regugritation. Takes nexium prn.   Last visit Dr Janace Hoard 07/21/19. Plans to have a follow up.  No hoarseness. No ha, vision loss.   HISTORY:  Past Medical History:  Diagnosis Date  . Anxiety   . Frequent headaches   . GERD (gastroesophageal reflux disease)   . Hepatitis   . Mitral valve prolapse    Mild   Past Surgical History:  Procedure Laterality Date  . LEEP  2012   Precancerous cells   Family History  Problem Relation Age of Onset  . Uterine cancer Maternal Grandmother   . Diabetes Maternal Grandmother   . Diabetes Father   . Hearing loss Father   . Diabetes Mother   . Heart attack Mother   . Asthma Mother   . Heart disease Mother   . Hypertension Mother   . Kidney disease Mother   . Miscarriages / [place] Mother   . Uterine cancer Paternal Aunt   . Diabetes Paternal Uncle   . Diabetes Paternal Grandfather   . Diabetes Paternal Grandmother   .  Diabetes Maternal Grandfather     Allergies: Penicillins Current Outpatient Medications on File Prior to Visit  Medication Sig Dispense Refill  . esomeprazole (NEXIUM) 20 MG capsule Take 20 mg by mouth as needed.     No current facility-administered medications on file prior to visit.    Social History   Tobacco Use  . Smoking status: Former Smoker    Types: Cigarettes    Quit date: 06/12/2011    Years since quitting: 8.1  . Smokeless tobacco: Never Used  Substance Use Topics  . Alcohol use: No  . Drug use: No    Review of Systems  Constitutional: Negative for chills and fever.  Respiratory: Negative for cough.   Cardiovascular: Negative for chest pain and palpitations.  Gastrointestinal: Negative for nausea and vomiting.  Musculoskeletal: Positive for back pain, myalgias and neck pain. Negative for joint swelling.  Skin: Negative for color change and rash.  Neurological: Negative for dizziness and numbness.  Hematological: Positive for adenopathy.      Objective:    BP 100/62   Pulse 92   Temp 97.9 F (36.6 C) (Temporal)   Ht 5' 4.5" (1.638 m)   Wt 176 lb 9.6 oz (80.1 kg)   SpO2 (!) 72%   BMI 29.85 kg/m  Physical Exam Vitals reviewed.  Constitutional:      Appearance: She is well-developed.  HENT:     Head: Normocephalic and atraumatic.     Right Ear: Hearing, tympanic membrane, ear canal and external ear normal. No decreased hearing noted. No drainage, swelling or tenderness. No middle ear effusion. No foreign body. Tympanic membrane is not erythematous or bulging.     Left Ear: Hearing, tympanic membrane, ear canal and external ear normal. No decreased hearing noted. No drainage, swelling or tenderness.  No middle ear effusion. No foreign body. Tympanic membrane is not erythematous or bulging.     Nose: Nose normal. No rhinorrhea.     Right Sinus: No maxillary sinus tenderness or frontal sinus tenderness.     Left Sinus: No maxillary sinus tenderness or  frontal sinus tenderness.     Mouth/Throat:     Tongue: No lesions.     Pharynx: Uvula midline. No oropharyngeal exudate or posterior oropharyngeal erythema.     Tonsils: No tonsillar abscesses.     Comments: Tonsils 3+/4 Eyes:     Conjunctiva/sclera: Conjunctivae normal.  Neck:     Thyroid: No thyroid mass or thyromegaly.      Comments: supraclavicular area of concern noted on diagram however is unable to appreciate any palpable abnormality.  No deformity of clavicle, swelling or tenderness.  Skin color is uniform.  No erythema or increased warmth Cardiovascular:     Rate and Rhythm: Normal rate and regular rhythm.     Pulses: Normal pulses.     Heart sounds: Normal heart sounds.  Pulmonary:     Effort: Pulmonary effort is normal.     Breath sounds: Normal breath sounds. No wheezing, rhonchi or rales.  Musculoskeletal:     Right shoulder: Normal. No swelling, deformity or effusion.     Left shoulder: Normal. No swelling, deformity or effusion.     Comments: Left Shoulder:   No asymmetry of shoulders when comparing right and left.No pain with palpation over glenohumeral joint lines, [place] joint, AC joint, or bicipital groove. No pain with internal and external rotation. No pain with resisted lateral extension .  Audible crepitus.   Strength and sensation normal BUE's.   Lymphadenopathy:     Head:     Right side of head: No submental, submandibular, tonsillar, preauricular, posterior auricular or occipital adenopathy.     Left side of head: No submental, submandibular, tonsillar, preauricular, posterior auricular or occipital adenopathy.     Cervical: No cervical adenopathy.     Upper Body:     Right upper body: No axillary adenopathy.     Left upper body: No axillary adenopathy.     Comments: No Axillary lymphadenopathy appreciated.  Skin:    General: Skin is warm and dry.  Neurological:     Mental Status: She is alert.  Psychiatric:        Speech: Speech normal.         Behavior: Behavior normal.        Thought Content: Thought content normal.        Assessment & Plan:   Problem List Items Addressed This Visit      Digestive   Esophageal reflux    Patient continues to complain of throat sensation, 'irritation.'  She voiced concern for esophageal cancer.  Reassured her that should not have any systemic features to raise suspicion however we agreed that we want to be  particularly cautious.  Referral has been placed to GI.  Patient  will also speak with her ear nose and throat at [facility], Dr. Barry Dienes in regards to her concern as well      Relevant Medications   esomeprazole (NEXIUM) 20 MG capsule     Other   Neck pain on left side    Etiology of symptoms is nonspecific at this time.  Unable to discern lymph node however per patient and as we discussed it sounds a bit migratory.  An abundance of caution is a certainly do not want to overlook an enlarged lymph node, pending CT head and neck.  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  Pending imaging at this time. Patient will also speak with her ear nose and throat at [facility], Dr. Barry Dienes in regards to her concern as well with lymph node.         Other Visit Diagnoses    Localized enlarged lymph nodes    -  Primary   Relevant Orders   CT SOFT TISSUE NECK W WO CONTRAST   Ambulatory referral to Gastroenterology   CBC with Differential/Platelet   DG Chest 2 View   DG Cervical Spine Complete   DG Shoulder Left   DG Clavicle Left   History of vitamin D deficiency       Relevant Orders   VITAMIN D 25 Hydroxy (Vit-D Deficiency, Fractures)        I have discontinued Lattie Haw R. Caudill's pantoprazole, cyclobenzaprine, and meloxicam. I am also having her maintain her esomeprazole.   No orders of the defined types were placed in this  encounter.   Return precautions given.   Risks, benefits, and alternatives of the medications and treatment plan prescribed today were discussed, and patient expressed understanding.   Education regarding symptom management and diagnosis given to patient on AVS.  Continue to follow with Burnard Hawthorne, FNP for routine health maintenance.   Gennette Pac and I agreed with plan.   Mable Paris, FNP

## 2019-08-14 NOTE — Patient Instructions (Signed)
I will order imaging to be very thorough here.    Please ensure you speak with your nose and throat, Dr. Janace Hoard in regards to concerns as well.     referral to gastroenterology.  Please ensure we give you a call about this in the next 7 days.  We will also call you to schedule your CT of your head neck, let us know if you hear from [place].  Please follow-up in 3-month

## 2019-08-14 NOTE — Assessment & Plan Note (Signed)
Patient continues to complain of throat sensation, 'irritation.'  She voiced concern for esophageal cancer.  Reassured her that should not have any systemic features to raise suspicion however we agreed that we want to be  particularly cautious.  Referral has been placed to GI.  Patient will also speak with her ear nose and throat at [facility], Dr. Barry Dienes in regards to her concern as well

## 2019-08-14 NOTE — Assessment & Plan Note (Addendum)
Etiology of symptoms is nonspecific at this time.  Unable to discern lymph node however per patient and as we discussed it sounds a bit migratory.  An abundance of caution is a certainly do not want to overlook an enlarged lymph node, pending CT head and neck.  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  Pending imaging at this time. Patient will also speak with her ear nose and throat at [facility], Dr. Barry Dienes in regards to her concern as well with lymph node.

## 2019-08-25 ENCOUNTER — Other Ambulatory Visit: Payer: Self-pay | Admitting: Family

## 2019-08-25 DIAGNOSIS — R590 Localized enlarged lymph nodes: Secondary | ICD-10-CM

## 2019-08-28 ENCOUNTER — Telehealth: Payer: Self-pay | Admitting: Family

## 2019-08-28 NOTE — Telephone Encounter (Signed)
I called pt and left vm with appt time date location and instructions for CT.

## 2019-08-31 ENCOUNTER — Encounter: Payer: Self-pay | Admitting: Family

## 2019-09-01 ENCOUNTER — Ambulatory Visit: Admission: RE | Admit: 2019-09-01 | Payer: Managed Care, Other (non HMO) | Source: Ambulatory Visit

## 2019-09-02 ENCOUNTER — Ambulatory Visit
Admission: RE | Admit: 2019-09-02 | Discharge: 2019-09-02 | Disposition: A | Discharge status: Home / Self Care | Payer: Managed Care, Other (non HMO) | Source: Ambulatory Visit | Attending: Family | Admitting: Family

## 2019-09-02 ENCOUNTER — Encounter: Payer: Self-pay | Admitting: Family

## 2019-09-02 ENCOUNTER — Other Ambulatory Visit: Payer: Self-pay

## 2019-09-02 DIAGNOSIS — R590 Localized enlarged lymph nodes: Secondary | ICD-10-CM | POA: Diagnosis not present

## 2019-09-02 MED ORDER — IOHEXOL 300 MG/ML  SOLN
75.0000 mL | Freq: Once | INTRAMUSCULAR | Status: AC | PRN
  Administered 2019-09-02: 75 mL via INTRAVENOUS

## 2019-09-05 ENCOUNTER — Encounter: Payer: Self-pay | Admitting: Family

## 2019-10-06 ENCOUNTER — Telehealth: Payer: Self-pay | Admitting: Family

## 2019-10-06 NOTE — Telephone Encounter (Signed)
Rejection Reason - Patient was No Show - Pt did not show or call to cancel/reschedule appt." [facility] said 1 day ago Kiara Rich

## 2019-10-07 NOTE — Telephone Encounter (Signed)
noted 

## 2019-10-13 DIAGNOSIS — R590 Localized enlarged lymph nodes: Secondary | ICD-10-CM | POA: Insufficient documentation

## 2019-11-16 ENCOUNTER — Ambulatory Visit: Payer: Managed Care, Other (non HMO) | Admitting: Family

## 2019-11-16 DIAGNOSIS — Z0289 Encounter for other administrative examinations: Secondary | ICD-10-CM

## 2020-01-01 ENCOUNTER — Encounter: Payer: Self-pay | Admitting: Family

## 2020-01-05 ENCOUNTER — Telehealth: Payer: Self-pay | Admitting: Family

## 2020-01-05 DIAGNOSIS — Z8489 Family history of other specified conditions: Secondary | ICD-10-CM

## 2020-01-05 NOTE — Telephone Encounter (Signed)
Spoke with pt  Patient has concerns that perhaps she could be this allergic as well and would like further evaluaiton.    With influenza vaccine, had sore throat, enlarged lymph node last year.   No h/o hives, trouble swallowing with any prior vaccines.  No history of anaphylaxis  Doesn't carry epi pen.   No known h/o PEG  Saw ENT in regards left enlarged biopsy. Lymph node hasnt gotten bigger.  Father has h/o severe allergies and carries epi pen.

## 2020-01-18 ENCOUNTER — Encounter: Payer: Self-pay | Admitting: Family

## 2020-01-18 ENCOUNTER — Other Ambulatory Visit: Payer: Self-pay

## 2020-01-18 ENCOUNTER — Ambulatory Visit (INDEPENDENT_AMBULATORY_CARE_PROVIDER_SITE_OTHER): Payer: Managed Care, Other (non HMO) | Admitting: Family

## 2020-01-18 VITALS — BP 98/72 | HR 71 | Temp 98.8°F | Ht 64.49 in | Wt 170.4 lb

## 2020-01-18 DIAGNOSIS — M542 Cervicalgia: Secondary | ICD-10-CM | POA: Diagnosis not present

## 2020-01-18 DIAGNOSIS — R102 Pelvic and perineal pain: Secondary | ICD-10-CM

## 2020-01-18 LAB — URINALYSIS, ROUTINE W REFLEX MICROSCOPIC
Bilirubin Urine: NEGATIVE
Ketones, ur: NEGATIVE
Nitrite: NEGATIVE
RBC / HPF: NONE SEEN (ref 0–?)
Specific Gravity, Urine: 1.015 (ref 1.000–1.030)
Total Protein, Urine: NEGATIVE
Urine Glucose: NEGATIVE
Urobilinogen, UA: 0.2 (ref 0.0–1.0)
pH: 7 (ref 5.0–8.0)

## 2020-01-18 MED ORDER — MELOXICAM 7.5 MG PO TABS: 7.5000 mg | ORAL_TABLET | Freq: Every day | ORAL | 1 refills | Status: DC | PRN

## 2020-01-18 MED ORDER — DICLOFENAC SODIUM 1 % EX GEL: 4.0000 g | Freq: Four times a day (QID) | CUTANEOUS | 1 refills | Status: DC

## 2020-01-18 NOTE — Progress Notes (Signed)
Subjective:    Patient ID: Kiara Rich, female    DOB: [birth year], 34 y.o.   MRN: [id number]  CC: Kiara Rich is a 34 y.o. female who presents today for an acute visit.    HPI: Complains of middle pelvic cramping when urinating x 2 weeks, comes and goes. Describe as 'menstrual cramp'  Feeling.  No hematuria, dysuria, fever, diarrhea, constipation,   H/o ovarian cyst on left ;GYN is Dr Leonides Schanz  LMP 01/18/20 Regular monthly cycles. Long history of severe menstrual cramping.  Times she feels like "a knife is being cut through her.'  She has discussed in the past with her OB/GYN whom advised NSAIDs.  She  declines being on the birth control at this time.   Continues to have posterior left sided neck pain, unchanged.  No HA, fever, numbness or weakness in arms Works as Char[employer] fundraiser.  Had meant to go to PT last year and was referred but she didn't due to covid  No h/o GIB.   No h/o abdominal surgeries.  No h/o diverticulitis     Allergy scheduled for 02/17/20 HISTORY:  Past Medical History:  Diagnosis Date  . Anxiety   . Frequent headaches   . GERD (gastroesophageal reflux disease)   . Hepatitis   . Mitral valve prolapse    Mild   Past Surgical History:  Procedure Laterality Date  . LEEP  2012   Precancerous cells   Family History  Problem Relation Age of Onset  . Uterine cancer Maternal Grandmother   . Diabetes Maternal Grandmother   . Diabetes Father   . Hearing loss Father   . Diabetes Mother   . Heart attack Mother   . Asthma Mother   . Heart disease Mother   . Hypertension Mother   . Kidney disease Mother   . Miscarriages / [place] Mother   . Uterine cancer Paternal Aunt   . Diabetes Paternal Uncle   . Diabetes Paternal Grandfather   . Diabetes Paternal Grandmother   . Diabetes Maternal Grandfather     Allergies: Penicillins Current Outpatient Medications on File Prior to Visit  Medication Sig Dispense Refill  . esomeprazole (NEXIUM) 20 MG  capsule Take 20 mg by mouth as needed.     No current facility-administered medications on file prior to visit.    Social History   Tobacco Use  . Smoking status: Former Smoker    Types: Cigarettes    Quit date: 06/12/2011    Years since quitting: 8.6  . Smokeless tobacco: Never Used  Substance Use Topics  . Alcohol use: No  . Drug use: No    Review of Systems  Constitutional: Negative for chills and fever.  Respiratory: Negative for cough.   Cardiovascular: Negative for chest pain and palpitations.  Gastrointestinal: Negative for abdominal pain, constipation, diarrhea, nausea and vomiting.  Genitourinary: Positive for pelvic pain and vaginal bleeding (on menses). Negative for difficulty urinating, dysuria and hematuria.  Musculoskeletal: Positive for neck pain.  Neurological: Negative for numbness and headaches.      Objective:    BP 98/72 (BP Location: Left Arm, Patient Position: Sitting)   Pulse 71   Temp 98.8 F (37.1 C)   Ht 5' 4.49" (1.638 m)   Wt 170 lb 6.4 oz (77.3 kg)   SpO2 98%   BMI 28.81 kg/m   BP Readings from Last 3 Encounters:  01/18/20 98/72  08/14/19 100/62  08/06/18 96/68    Physical Exam Vitals reviewed.  Constitutional:  Appearance: Normal appearance. She is well-developed.  Eyes:     Conjunctiva/sclera: Conjunctivae normal.  Neck:   Cardiovascular:     Rate and Rhythm: Normal rate and regular rhythm.     Pulses: Normal pulses.     Heart sounds: Normal heart sounds.  Pulmonary:     Effort: Pulmonary effort is normal.     Breath sounds: Normal breath sounds. No wheezing, rhonchi or rales.  Abdominal:     General: Bowel sounds are normal. There is no distension.     Palpations: Abdomen is soft. Abdomen is not rigid. There is no fluid wave or mass.     Tenderness: There is no abdominal tenderness. There is no guarding or rebound.  Musculoskeletal:     Cervical back: Full passive range of motion without pain and neck supple. No edema  or erythema. Muscular tenderness (left sided as marked on diagram) present. No spinous process tenderness. Normal range of motion.  Skin:    General: Skin is warm and dry.  Neurological:     Mental Status: She is alert.  Psychiatric:        Speech: Speech normal.        Behavior: Behavior normal.        Thought Content: Thought content normal.        Assessment & Plan:   Problem List Items Addressed This Visit      Other   Neck pain on left side - Primary    Suspect neck pain exacerbated by patient's line of work frequent forward flexion of the neck.  We discussed NSAID prn, heat and physical therapy.  She was in agreement with all.  Advised her to use meloxicam as needed and take with food.  Referral to physical therapy has been placed.  Patient will let me know how she is doing      Relevant Medications   diclofenac Sodium (VOLTAREN) 1 % GEL   meloxicam (MOBIC) 7.5 MG tablet   Other Relevant Orders   Ambulatory referral to Physical Therapy   Pelvic pain    Afebrile,well appearing and patient is in no acute distress.  Benign abdominal exam.  Etiology of pelvic pain nonspecific at this time.  She is on her menses question whether menstrual cramping.  She has a longstanding history of pretty severe menstrual cramps.  Pending urine studies at this time patient referred awaiting on urine studies prior to any empiric treatment for UTI.  Patient will let me know of any new or worsening symptoms      Relevant Orders   Urinalysis, Routine w reflex microscopic   Urine Culture       I am having Lattie Haw R. Register start on diclofenac Sodium and meloxicam. I am also having her maintain her esomeprazole.   Meds ordered this encounter  Medications  . diclofenac Sodium (VOLTAREN) 1 % GEL    Sig: Apply 4 g topically 4 (four) times daily.    Dispense:  50 g    Refill:  1    Order Specific Question:   Supervising Provider    Answer:   Derrel Nip, TERESA L [[id number]]  . meloxicam (MOBIC) 7.5 MG  tablet    Sig: Take 1 tablet (7.5 mg total) by mouth daily as needed for pain.    Dispense:  30 tablet    Refill:  1    Order Specific Question:   Supervising Provider    Answer:   Crecencio Mc [[id number]]    Return precautions  given.   Risks, benefits, and alternatives of the medications and treatment plan prescribed today were discussed, and patient expressed understanding.   Education regarding symptom management and diagnosis given to patient on AVS.  Continue to follow with Burnard Hawthorne, FNP for routine health maintenance.   Gennette Pac and I agreed with plan.   Mable Paris, FNP

## 2020-01-18 NOTE — Assessment & Plan Note (Signed)
Suspect neck pain exacerbated by patient's line of work frequent forward flexion of the neck.  We discussed NSAID prn, heat and physical therapy.  She was in agreement with all.  Advised her to use meloxicam as needed and take with food.  Referral to physical therapy has been placed.  Patient will let me know how she is doing

## 2020-01-18 NOTE — Patient Instructions (Signed)
Referral to physical therapy.  I sent in meloxicam to use as needed for neck pain. I have also sent in voltaren gel  We will await urine studies  Stay safe!

## 2020-01-18 NOTE — Assessment & Plan Note (Addendum)
Afebrile,well appearing and patient is in no acute distress.  Benign abdominal exam.  Etiology of pelvic pain nonspecific at this time.  She is on her menses question whether menstrual cramping.  She has a longstanding history of pretty severe menstrual cramps.  Pending urine studies at this time patient referred awaiting on urine studies prior to any empiric treatment for UTI.  Patient will let me know of any new or worsening symptoms

## 2020-01-19 ENCOUNTER — Encounter: Payer: Self-pay | Admitting: Family

## 2020-01-19 LAB — URINE CULTURE
MICRO NUMBER:: 10802674
Result:: NO GROWTH
SPECIMEN QUALITY:: ADEQUATE

## 2020-02-16 NOTE — Progress Notes (Deleted)
New Patient Note  RE: Kiara Rich MRN: [id number] DOB: August 01, 1985 Date of Office Visit: 02/17/2020  Referring provider: Burnard Hawthorne, FNP Primary care provider: Burnard Hawthorne, FNP  Chief Complaint: No chief complaint on file.  History of Present Illness: I had the pleasure of seeing Kiara Rich for initial evaluation at the [facility] on 02/16/2020. She is a 34 y.o. female, who is referred here by Burnard Hawthorne, FNP for the evaluation of ***.  ***  Assessment and Plan: Kiara Rich is a 34 y.o. female with: No problem-specific Assessment & Plan notes found for this encounter.  No follow-ups on file.  No orders of the defined types were placed in this encounter.  Lab Orders  No laboratory test(s) ordered today    Other allergy screening: Asthma: {Blank single:19197::"yes","no"} Rhino conjunctivitis: {Blank single:19197::"yes","no"} Food allergy: {Blank single:19197::"yes","no"} Medication allergy: {Blank single:19197::"yes","no"} Hymenoptera allergy: {Blank single:19197::"yes","no"} Urticaria: {Blank single:19197::"yes","no"} Eczema:{Blank single:19197::"yes","no"} History of recurrent infections suggestive of immunodeficency: {Blank single:19197::"yes","no"}  Diagnostics: Spirometry:  Tracings reviewed. Her effort: {Blank single:19197::"Good reproducible efforts.","It was hard to get consistent efforts and there is a question as to whether this reflects a maximal maneuver.","Poor effort, data can not be interpreted."} FVC: ***L FEV1: ***L, ***% predicted FEV1/FVC ratio: ***% Interpretation: {Blank single:19197::"Spirometry consistent with mild obstructive disease","Spirometry consistent with moderate obstructive disease","Spirometry consistent with severe obstructive disease","Spirometry consistent with possible restrictive disease","Spirometry consistent with mixed obstructive and restrictive disease","Spirometry uninterpretable due to  technique","Spirometry consistent with normal pattern","No overt abnormalities noted given today's efforts"}.  Please see scanned spirometry results for details.  Skin Testing: {Blank single:19197::"Select foods","Environmental allergy panel","Environmental allergy panel and select foods","Food allergy panel","None","Deferred due to recent antihistamines use"}. Positive test to: ***. Negative test to: ***.  Results discussed with patient/family.   Past Medical History: Patient Active Problem List   Diagnosis Date Noted  . Pelvic pain 01/18/2020  . Numbness and tingling in both hands 08/06/2018  . DUB (dysfunctional uterine bleeding) 05/05/2018  . MVP (mitral valve prolapse) 05/05/2018  . Neck pain on left side 05/05/2018  . Chest wall pain 05/05/2018  . Esophageal reflux 12/04/2013  . 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 Anxiety   . Frequent headaches   . GERD (gastroesophageal reflux disease)   . Mitral valve prolapse    Mild   Past Surgical History: Past Surgical History:  Procedure Laterality Date  . LEEP  2012   Precancerous cells   Medication List:  Current Outpatient Medications  Medication Sig Dispense Refill  . diclofenac Sodium (VOLTAREN) 1 % GEL Apply 4 g topically 4 (four) times daily. 50 g 1  . esomeprazole (NEXIUM) 20 MG capsule Take 20 mg by mouth as needed.    . meloxicam (MOBIC) 7.5 MG tablet Take 1 tablet (7.5 mg total) by mouth daily as needed for pain. 30 tablet 1   No current facility-administered medications for this visit.   Allergies: Allergies  Allergen Reactions  . Penicillins Itching   Social History: Social History   Socioeconomic History  . Marital status: Married    Spouse name: Not on file  . Number of children: 2  . Years of education: Not on file  . Highest education level: Not on file  Occupational History  . Occupation: Programmer, systems: [employer]  Tobacco Use  .  Smoking status: Former Smoker    Types: Cigarettes    Quit date: 06/12/2011  Years since quitting: 8.6  . Smokeless tobacco: Never Used  Substance and Sexual Activity  . Alcohol use: No  . Drug use: No  . Sexual activity: Yes  Other Topics Concern  . Not on file  Social History Narrative   Married   phlebtomist   73 year old son   11 year old son            Social Determinants of Radio broadcast assistant Strain:   . Difficulty of Paying Living Expenses: Not on file  Food Insecurity:   . Worried About Charity fundraiser in the Last Year: Not on file  . Ran Out of Food in the Last Year: Not on file  Transportation Needs:   . Lack of Transportation (Medical): Not on file  . Lack of Transportation (Non-Medical): Not on file  Physical Activity:   . Days of Exercise per Week: Not on file  . Minutes of Exercise per Session: Not on file  Stress:   . Feeling of Stress : Not on file  Social Connections:   . Frequency of Communication with Friends and Family: Not on file  . Frequency of Social Gatherings with Friends and Family: Not on file  . Attends Religious Services: Not on file  . Active Member of Clubs or Organizations: Not on file  . Attends Archivist Meetings: Not on file  . Marital Status: Not on file   Lives in a ***. Smoking: *** Occupation: ***  Environmental HistoryFreight forwarder in the house: Estate agent in the family room: {Blank single:19197::"yes","no"} Carpet in the bedroom: {Blank single:19197::"yes","no"} Heating: {Blank single:19197::"electric","gas"} Cooling: {Blank single:19197::"central","window"} Pet: {Blank single:19197::"yes ***","no"}  Family History: Family History  Problem Relation Age of Onset  . Uterine cancer Maternal Grandmother   . Diabetes Maternal Grandmother   . Diabetes Father   . Hearing loss Father   . Diabetes Mother   . Heart attack Mother   . Asthma Mother   . Heart  disease Mother   . Hypertension Mother   . Kidney disease Mother   . Miscarriages / [place] Mother   . Uterine cancer Paternal Aunt   . Diabetes Paternal Uncle   . Diabetes Paternal Grandfather   . Diabetes Paternal Grandmother   . Diabetes Maternal Grandfather    Problem                               Relation Asthma                                   *** Eczema                                *** Food allergy                          *** Allergic rhino conjunctivitis     ***  Review of Systems  Constitutional: Negative for appetite change, chills, fever and unexpected weight change.  HENT: Negative for congestion and rhinorrhea.   Eyes: Negative for itching.  Respiratory: Negative for cough, chest tightness, shortness of breath and wheezing.   Cardiovascular: Negative for chest pain.  Gastrointestinal: Negative for abdominal pain.  Genitourinary: Negative for difficulty urinating.  Skin: Negative for  rash.  Neurological: Negative for headaches.   Objective: There were no vitals taken for this visit. There is no height or weight on file to calculate BMI. Physical Exam Vitals and nursing note reviewed.  Constitutional:      Appearance: Normal appearance. She is well-developed.  HENT:     Head: Normocephalic and atraumatic.     Right Ear: External ear normal.     Left Ear: External ear normal.     Nose: Nose normal.     Mouth/Throat:     Mouth: Mucous membranes are moist.     Pharynx: Oropharynx is clear.  Eyes:     Conjunctiva/sclera: Conjunctivae normal.  Cardiovascular:     Rate and Rhythm: Normal rate and regular rhythm.     Heart sounds: Normal heart sounds. No murmur heard.  No friction rub. No gallop.   Pulmonary:     Effort: Pulmonary effort is normal.     Breath sounds: Normal breath sounds. No wheezing, rhonchi or rales.  Abdominal:     Palpations: Abdomen is soft.  Musculoskeletal:     Cervical back: Neck supple.  Skin:    General: Skin is warm.      Findings: No rash.  Neurological:     Mental Status: She is alert and oriented to person, place, and time.  Psychiatric:        Behavior: Behavior normal.    The plan was reviewed with the patient/family, and all questions/concerned were addressed.  It was my pleasure to see Kiara Rich today and participate in her care. Please feel free to contact me with any questions or concerns.  Sincerely,  Rexene Alberts, DO Allergy & Immunology  [facility] [place] office: [phone] [facility] office: [place] office: [phone]

## 2020-02-17 ENCOUNTER — Ambulatory Visit: Payer: Self-pay | Admitting: Allergy

## 2020-02-24 ENCOUNTER — Other Ambulatory Visit: Payer: Self-pay | Admitting: Family

## 2020-02-24 ENCOUNTER — Encounter: Payer: Self-pay | Admitting: Family

## 2020-02-24 DIAGNOSIS — Z0000 Encounter for general adult medical examination without abnormal findings: Secondary | ICD-10-CM

## 2020-02-24 DIAGNOSIS — Z1159 Encounter for screening for other viral diseases: Secondary | ICD-10-CM

## 2020-02-24 NOTE — Telephone Encounter (Signed)
Pt called about MyChart message

## 2020-02-29 ENCOUNTER — Encounter: Payer: Self-pay | Admitting: Family

## 2020-03-04 ENCOUNTER — Encounter: Payer: Self-pay | Admitting: Family

## 2020-03-04 ENCOUNTER — Telehealth (INDEPENDENT_AMBULATORY_CARE_PROVIDER_SITE_OTHER): Payer: Managed Care, Other (non HMO) | Admitting: Family

## 2020-03-04 DIAGNOSIS — T50B95D Adverse effect of other viral vaccines, subsequent encounter: Secondary | ICD-10-CM | POA: Diagnosis not present

## 2020-03-04 DIAGNOSIS — T50B95A Adverse effect of other viral vaccines, initial encounter: Secondary | ICD-10-CM | POA: Insufficient documentation

## 2020-03-04 NOTE — Assessment & Plan Note (Signed)
Lymphadenopathy and pharyngitis. Advised that I would complete vaccine exemption for based on objective findings that she had. She will fax to me. Encouraged consult with allergy to further discern vaccine side effects regarding influenza and covid vaccine. She will call to reschedule with allergy.

## 2020-03-04 NOTE — Progress Notes (Signed)
Virtual Visit via Video Note  I connected with@  on 03/04/20 at  8:00 AM EDT by a video enabled telemedicine application and verified that I am speaking with the correct person using two identifiers.  Location patient: home Location provider:work  Persons participating in the virtual visit: patient, provider  I discussed the limitations of evaluation and management by telemedicine and the availability of in person appointments. The patient expressed understanding and agreed to proceed.   HPI: Reason for appointment is to discuss exemption with influenza for employer .  Last year had left sided lymph enlarged, sore throat a couple of days of after influenza vaccine.  She endorsed fatigue, painful swallowing which resolved in a few days. Enlarged lymph node remained for months.   No trouble swallowing or breathing, hives, fever. No other vaccination side effects.  Notes 2 year prior with influenza vaccines, she had headache and cold chills. 'I get sick every time.' Pending allergy appointment; planning to reschedule.  ROS: See pertinent positives and negatives per HPI.    EXAM:  VITALS per patient if applicable:   GENERAL: alert, oriented, appears well and in no acute distress  HEENT: atraumatic, conjunttiva clear, no obvious abnormalities on inspection of external nose and ears  NECK: normal movements of the head and neck  LUNGS: on inspection no signs of respiratory distress, breathing rate appears normal, no obvious gross SOB, gasping or wheezing  CV: no obvious cyanosis  MS: moves all visible extremities without noticeable abnormality  PSYCH/NEURO: pleasant and cooperative, no obvious depression or anxiety, speech and thought processing grossly intact  ASSESSMENT AND PLAN:  Discussed the following assessment and plan:  Problem List Items Addressed This Visit      Other   Adverse effect of influenza vaccine    Lymphadenopathy and pharyngitis. Advised that I would  complete vaccine exemption for based on objective findings that she had. She will fax to me. Encouraged consult with allergy to further discern vaccine side effects regarding influenza and covid vaccine. She will call to reschedule with allergy.           -we discussed possible serious and likely etiologies, options for evaluation and workup, limitations of telemedicine visit vs in person visit, treatment, treatment risks and precautions. Pt prefers to treat via telemedicine empirically rather then risking or undertaking an in person visit at this moment.  .   I discussed the assessment and treatment plan with the patient. The patient was provided an opportunity to ask questions and all were answered. The patient agreed with the plan and demonstrated an understanding of the instructions.   The patient was advised to call back or seek an in-person evaluation if the symptoms worsen or if the condition fails to improve as anticipated.   Mable Paris, FNP

## 2020-03-04 NOTE — Patient Instructions (Signed)
Nice to see you! Send me your vaccine form when you can.

## 2020-03-10 ENCOUNTER — Other Ambulatory Visit: Payer: Self-pay

## 2020-03-10 ENCOUNTER — Other Ambulatory Visit (INDEPENDENT_AMBULATORY_CARE_PROVIDER_SITE_OTHER): Payer: Managed Care, Other (non HMO)

## 2020-03-10 DIAGNOSIS — Z0000 Encounter for general adult medical examination without abnormal findings: Secondary | ICD-10-CM | POA: Diagnosis not present

## 2020-03-10 DIAGNOSIS — Z1159 Encounter for screening for other viral diseases: Secondary | ICD-10-CM

## 2020-03-10 LAB — CBC WITH DIFFERENTIAL/PLATELET
Basophils Absolute: 0 10*3/uL (ref 0.0–0.1)
Basophils Relative: 0.2 % (ref 0.0–3.0)
Eosinophils Absolute: 0.1 10*3/uL (ref 0.0–0.7)
Eosinophils Relative: 1.4 % (ref 0.0–5.0)
HCT: 38.5 % (ref 36.0–46.0)
Hemoglobin: 12.9 g/dL (ref 12.0–15.0)
Lymphocytes Relative: 18.6 % (ref 12.0–46.0)
Lymphs Abs: 1.5 10*3/uL (ref 0.7–4.0)
MCHC: 33.6 g/dL (ref 30.0–36.0)
MCV: 91 fl (ref 78.0–100.0)
Monocytes Absolute: 0.5 10*3/uL (ref 0.1–1.0)
Monocytes Relative: 6.2 % (ref 3.0–12.0)
Neutro Abs: 5.8 10*3/uL (ref 1.4–7.7)
Neutrophils Relative %: 73.6 % (ref 43.0–77.0)
Platelets: 211 10*3/uL (ref 150.0–400.0)
RBC: 4.23 Mil/uL (ref 3.87–5.11)
RDW: 13.2 % (ref 11.5–15.5)
WBC: 7.9 10*3/uL (ref 4.0–10.5)

## 2020-03-10 LAB — TSH: TSH: 1.26 u[IU]/mL (ref 0.35–4.50)

## 2020-03-10 LAB — COMPREHENSIVE METABOLIC PANEL
ALT: 11 U/L (ref 0–35)
AST: 11 U/L (ref 0–37)
Albumin: 4.5 g/dL (ref 3.5–5.2)
Alkaline Phosphatase: 57 U/L (ref 39–117)
BUN: 11 mg/dL (ref 6–23)
CO2: 29 mEq/L (ref 19–32)
Calcium: 9.2 mg/dL (ref 8.4–10.5)
Chloride: 103 mEq/L (ref 96–112)
Creatinine, Ser: 0.74 mg/dL (ref 0.40–1.20)
GFR: 89.05 mL/min (ref >60.00)
Glucose, Bld: 99 mg/dL (ref 70–99)
Potassium: 3.9 mEq/L (ref 3.5–5.1)
Sodium: 137 mEq/L (ref 135–145)
Total Bilirubin: 0.5 mg/dL (ref 0.2–1.2)
Total Protein: 7.1 g/dL (ref 6.0–8.3)

## 2020-03-10 LAB — HEMOGLOBIN A1C: Hgb A1c MFr Bld: 5.3 % (ref 4.6–6.5)

## 2020-03-10 LAB — LIPID PANEL
Cholesterol: 130 mg/dL (ref 0–200)
HDL: 42.5 mg/dL (ref >39.00)
LDL Cholesterol: 70 mg/dL (ref 0–99)
NonHDL: 87.06
Total CHOL/HDL Ratio: 3
Triglycerides: 84 mg/dL (ref 0.0–149.0)
VLDL: 16.8 mg/dL (ref 0.0–40.0)

## 2020-03-10 LAB — VITAMIN D 25 HYDROXY (VIT D DEFICIENCY, FRACTURES): VITD: 32.52 ng/mL (ref 30.00–100.00)

## 2020-03-11 ENCOUNTER — Encounter: Payer: Self-pay | Admitting: Family

## 2020-03-11 LAB — HEPATITIS C ANTIBODY
Hepatitis C Ab: NONREACTIVE
SIGNAL TO CUT-OFF: 0.01 (ref <1.00)

## 2020-03-11 LAB — HIV ANTIBODY (ROUTINE TESTING W REFLEX): HIV 1&2 Ab, 4th Generation: NONREACTIVE

## 2020-03-16 ENCOUNTER — Encounter: Payer: Self-pay | Admitting: Family

## 2020-04-14 ENCOUNTER — Ambulatory Visit: Payer: Managed Care, Other (non HMO) | Admitting: Dermatology

## 2020-04-14 ENCOUNTER — Ambulatory Visit (INDEPENDENT_AMBULATORY_CARE_PROVIDER_SITE_OTHER): Payer: Managed Care, Other (non HMO) | Admitting: Dermatology

## 2020-04-14 ENCOUNTER — Other Ambulatory Visit: Payer: Self-pay

## 2020-04-14 DIAGNOSIS — D229 Melanocytic nevi, unspecified: Secondary | ICD-10-CM

## 2020-04-14 DIAGNOSIS — L821 Other seborrheic keratosis: Secondary | ICD-10-CM

## 2020-04-14 DIAGNOSIS — Z1283 Encounter for screening for malignant neoplasm of skin: Secondary | ICD-10-CM | POA: Diagnosis not present

## 2020-04-14 DIAGNOSIS — L814 Other melanin hyperpigmentation: Secondary | ICD-10-CM

## 2020-04-14 DIAGNOSIS — D1800 Hemangioma unspecified site: Secondary | ICD-10-CM

## 2020-04-14 DIAGNOSIS — L700 Acne vulgaris: Secondary | ICD-10-CM

## 2020-04-14 DIAGNOSIS — D225 Melanocytic nevi of trunk: Secondary | ICD-10-CM | POA: Diagnosis not present

## 2020-04-14 DIAGNOSIS — L813 Cafe au lait spots: Secondary | ICD-10-CM

## 2020-04-14 DIAGNOSIS — L578 Other skin changes due to chronic exposure to nonionizing radiation: Secondary | ICD-10-CM

## 2020-04-14 DIAGNOSIS — D171 Benign lipomatous neoplasm of skin and subcutaneous tissue of trunk: Secondary | ICD-10-CM | POA: Diagnosis not present

## 2020-04-14 MED ORDER — TRETINOIN 0.025 % EX CREA: TOPICAL_CREAM | Freq: Every evening | CUTANEOUS | 2 refills | Status: DC

## 2020-04-14 MED ORDER — DAPSONE 7.5 % EX GEL: CUTANEOUS | 2 refills | Status: DC

## 2020-04-14 NOTE — Progress Notes (Signed)
New Patient Visit  Subjective  Kiara Rich is a 34 y.o. female who presents for the following: Acne and FBSE.  Patient here for full body skin exam and skin cancer screening. No personal or family history of skin cancer. Patient does have what she thinks may be a lipoma on her back for years but has not changed.   Patient also would like to discuss acne. She has had acne for at least 10 years at face, chest and back. Patient has only used over the counter acne products. It does get worse with periods. She is not on any birth control and does get cystic bumps.   The following portions of the chart were reviewed this encounter and updated as appropriate:  Tobacco  Allergies  Meds  Problems  Med Hx  Surg Hx  Fam Hx      Review of Systems:  No other skin or systemic complaints except as noted in HPI or Assessment and Plan.  Objective  Well appearing patient in no apparent distress; mood and affect are within normal limits.  A full examination was performed including scalp, head, eyes, ears, nose, lips, neck, chest, axillae, abdomen, back, buttocks, bilateral upper extremities, bilateral lower extremities, hands, feet, fingers, toes, fingernails, and toenails. All findings within normal limits unless otherwise noted below.  Objective  Face: Trace open comedones, scattered inflammatory papules at face and under jaw Rare inflammatory papule at chest Back with few inflammatory papules and pustules, few scars at shoulders  Objective  Left back left of midline: Soft subcutaneous nodule  Objective  Left Lower Back: 0.4cm dark brown thin papule   Assessment & Plan  Acne vulgaris Face  Chronic condition, no cure, only control. Currently flared. Worse with menses.  Start tretinoin 0.025% cream to face as directed nightly as tolerated.  Start Aczone 7.5% to face in the mornings.  Consider adding spironolactone if not improving.   Topical retinoid medications like  tretinoin/Retin-A, adapalene/Differin, tazarotene/Fabior, and Epiduo/Epiduo Forte can cause dryness and irritation when first started. Only apply a pea-sized amount to the entire affected area. Avoid applying it around the eyes, edges of mouth and creases at the nose. If you experience irritation, use a good moisturizer first and/or apply the medicine less often. If you are doing well with the medicine, you can increase how often you use it until you are applying every night. Be careful with sun protection while using this medication as it can make you sensitive to the sun. This medicine should not be used by pregnant women.    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  Observation.  Call clinic for new or changing lesions.  Discussed option of excision if becomes bothersome.  Nevus Left Lower Back  Benign-appearing.  Observation.  Call clinic for new or changing lesions.  Recommend daily use of broad spectrum spf 30+ sunscreen to sun-exposed areas.     Lentigines - Scattered tan macules - Discussed due to sun exposure - Benign, observe - Call for any changes  Seborrheic Keratoses - Stuck-on, waxy, tan-brown papules and plaques  - Discussed benign etiology and prognosis. - Observe - Call for any changes  Melanocytic Nevi - Tan-brown and/or pink-flesh-colored symmetric macules and papules - Benign appearing on exam today - Observation - Call clinic for new or changing moles - Recommend daily use of broad spectrum spf 30+ sunscreen to  sun-exposed areas.   Hemangiomas - Red papules - Discussed benign nature - Observe - Call for any changes  Actinic Damage - Chronic, secondary to cumulative UV/sun exposure - diffuse scaly erythematous macules with underlying dyspigmentation - Recommend daily broad spectrum sunscreen SPF 30+ to sun-exposed areas, reapply every 2 hours as needed.    - Call for new or changing lesions.  Skin cancer screening performed today.  Cafe au Lait  - Tan patch at right calf - Genetic - Benign, observe - Call for any changes  Return 2-3 months, for Acne, 1 year TBSE.  Graciella Belton, RMA, am acting as scribe for Forest Gleason, MD .  Documentation: I have reviewed the above documentation for accuracy and completeness, and I agree with the above.  Forest Gleason, MD

## 2020-04-14 NOTE — Patient Instructions (Addendum)
Melanoma ABCDEs  Melanoma is the most dangerous type of skin cancer, and is the leading cause of death from skin disease.  You are more likely to develop melanoma if you:  Have light-colored skin, light-colored eyes, or red or blond hair  Spend a lot of time in the sun  Tan regularly, either outdoors or in a tanning bed  Have had blistering sunburns, especially during childhood  Have a close family member who has had a melanoma  Have atypical moles or large birthmarks  Early detection of melanoma is key since treatment is typically straightforward and cure rates are extremely high if we catch it early.   The first sign of melanoma is often a change in a mole or a new dark spot.  The ABCDE system is a way of remembering the signs of melanoma.  A for asymmetry:  The two halves do not match. B for border:  The edges of the growth are irregular. C for color:  A mixture of colors are present instead of an even brown color. D for diameter:  Melanomas are usually (but not always) greater than 90mm - the size of a pencil eraser. E for evolution:  The spot keeps changing in size, shape, and color.  Please check your skin once per month between visits. You can use a small mirror in front and a large mirror behind you to keep an eye on the back side or your body.   If you see any new or changing lesions before your next follow-up, please call to schedule a visit.  Please continue daily skin protection including broad spectrum sunscreen SPF 30+ to sun-exposed areas, reapplying every 2 hours as needed when you're outdoors.   Topical retinoid medications like tretinoin/Retin-A, adapalene/Differin, tazarotene/Fabior, and Epiduo/Epiduo Forte can cause dryness and irritation when first started. Only apply a pea-sized amount to the entire affected area. Avoid applying it around the eyes, edges of mouth and creases at the nose. If you experience irritation, use a good moisturizer first and/or apply the  medicine less often. If you are doing well with the medicine, you can increase how often you use it until you are applying every night. Be careful with sun protection while using this medication as it can make you sensitive to the sun. This medicine should not be used by pregnant women.   Your medications have been sent to [facility] and will be mailed to you after you call them and confirm your information with them.   [facility] [phone] [place], [place] [address]  Viral Warts & Molluscum Contagiosum  Viral warts and molluscum contagiosum are growths of the skin caused by viral infection of the skin. If you have been given the diagnosis of viral warts or molluscum contagiosum there are a few things that you must understand about your condition:  1. There is no guaranteed treatment method available for this condition. 2. Multiple treatments may be required, 3. The treatments may be time consuming and require multiple visits to the dermatology office. 4. The treatment may be expensive. You will be charged each time you come into the office to have the spots treated. 5. The treated areas may develop new lesions further complicating treatment. 6. The treated areas may leave a scar. 7. There is no guarantee that even after multiple treatments that the spots will be successfully treated. 8. These are caused by a viral infection and can be spread to other areas of the skin and  to other people by direct contact. Therefore, new spots may occur.  Can treat at home with salicylic acid and cover with duct tape nightly.

## 2020-05-02 ENCOUNTER — Encounter: Payer: Self-pay | Admitting: Dermatology

## 2020-06-01 ENCOUNTER — Encounter: Payer: Self-pay | Admitting: Family

## 2020-06-22 ENCOUNTER — Ambulatory Visit: Payer: Managed Care, Other (non HMO) | Admitting: Physician Assistant

## 2020-06-28 NOTE — Progress Notes (Signed)
Office Visit    Patient Name: Kiara Rich Date of Encounter: 07/07/2020  Primary Care Provider:  Burnard Hawthorne, FNP Primary Cardiologist:  Ida Rogue, MD Electrophysiologist:  None   Chief Complaint    Kiara Rich is a 35 y.o. female with a hx of MVP, GERD, dizziness, palpitations, COVID19 05/2020 not requiring hospitalization presents today for palpitations and shortness of breath.   Past Medical History    Past Medical History:  Diagnosis Date  . Anxiety   . Frequent headaches   . GERD (gastroesophageal reflux disease)   . Mitral valve prolapse    Mild   Past Surgical History:  Procedure Laterality Date  . LEEP  2012   Precancerous cells    Allergies  Allergies  Allergen Reactions  . Penicillins Itching    History of Present Illness    Kiara Rich is a 35 y.o. female with a hx of MVP, GERD, dizziness, palpitations, COVID19 05/2020 not requiring hospitalization last seen 08/05/18 by Dr. Rockey Situ.  Per previous documentation Kiara Rich was told she had a "mild MVP" in 2014. When last seen 07/2018 she reported intermittent dizziness which was presumed to be due to orthostasis. She was recommended to stay well hydrated and wear compression stockings.   She presents today for follow up.  Shares with me that she had Covid in December and since that time has been noticing some increased shortness of breath at rest and with activity.  She has also been following with ENT for some neck issues and has been recommended to have her tonsils out but prefers to ensure her heart is in good condition prior to proceeding with surgery.  She reports occasional palpitations that occur at rest and with activity that feel like a "flip-flop "and seem to be happening more often.  She reports lightheadedness but no dizziness which she attributes to her ear issues.  She was seen by her ENT last week with crystals in her ears and they did Epley maneuver which improved  symptoms.  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  No formal exercise routine.  Tells me she drinks water throughout the day and has only 1 cup of coffee in the morning.  She eats regular meals.  EKGs/Labs/Other Studies Reviewed:   The following studies were reviewed today:  EKG:  EKG is ordered today.  The ekg ordered today demonstrates NSR 66 bpm with no acute ST/T wave changes.   Recent Labs: 03/10/2020: ALT 11; BUN 11; Creatinine, Ser 0.74; Hemoglobin 12.9; Platelets 211.0; Potassium 3.9; Sodium 137; TSH 1.26  Recent Lipid Panel    Component Value Date/Time   CHOL 130 03/10/2020 0803   CHOL 153 12/02/2012 1022   TRIG 84.0 03/10/2020 0803   TRIG 104 12/02/2012 1022   HDL 42.50 03/10/2020 0803   HDL 60 12/02/2012 1022   CHOLHDL 3 03/10/2020 0803   VLDL 16.8 03/10/2020 0803   VLDL 21 12/02/2012 1022   LDLCALC 70 03/10/2020 0803   LDLCALC 72 12/02/2012 1022   Home Medications   Current Meds  Medication Sig  . esomeprazole (NEXIUM) 20 MG capsule Take 20 mg by mouth as needed.    Review of Systems   Review of Systems  Constitutional: Negative for chills, fever and malaise/fatigue.  Cardiovascular: Positive for palpitations. Negative for chest pain, dyspnea on exertion, irregular heartbeat, leg swelling, near-syncope, orthopnea and syncope.  Respiratory: Positive for shortness of breath. Negative for cough and wheezing.   Gastrointestinal:  Negative for melena, nausea and vomiting.  Genitourinary: Negative for hematuria.  Neurological: Positive for light-headedness. Negative for dizziness and weakness.   All other systems reviewed and are otherwise negative except as noted above.  Physical Exam    VS:  BP 100/70 (BP Location: Left Arm, Patient Position: Sitting, Cuff Size: Normal)   Pulse 66   Ht 5\' 4"  (1.626 m)   Wt 163 lb (73.9 kg)   SpO2 98%   BMI 27.98 kg/m  , BMI Body mass index is 27.98 kg/m.  Wt Readings from Last 3 Encounters:  07/07/20 163 lb  (73.9 kg)  01/18/20 170 lb 6.4 oz (77.3 kg)  08/14/19 176 lb 9.6 oz (80.1 kg)    GEN: Well nourished, well developed, in no acute distress. HEENT: normal. Neck: Supple, no JVD, carotid bruits, or masses. Cardiac: RRR, no murmurs, rubs, or gallops. No clubbing, cyanosis, edema.  Radials/DP/PT 2+ and equal bilaterally.  Respiratory:  Respirations regular and unlabored, clear to auscultation bilaterally. GI: Soft, nontender, nondistended. MS: No deformity or atrophy. Skin: Warm and dry, no rash. Neuro:  Strength and sensation are intact. Psych: Normal affect.  Assessment & Plan    1. Mild MVP - By echo 2014.  Update echocardiogram for monitoring.  Due to worsening shortness of breath and increased palpitations.  If unrevealing her shortness of breath is likely due to COVID-19 infection last month and deconditioning.   2. Palpitations - History of mild MVP by echo 2014.  14-day ZIO monitor placed in clinic. Update echocardiogram. Lab work 02/2020 with normal CBC, electrolytes, TSH.  She drinks only 1 cup of caffeinated coffee per day.  No over-the-counter proarrhythmic's.  Encouraged to stay well-hydrated.  3. Lightheadedness-reports intermittent lightheadedness which she tells me improved after Epley maneuver her ENT office last week.  Likely etiology vertigo.  Updating echocardiogram, as above.  Encouraged to stay well-hydrated and make position changes slowly.  Disposition: Follow up in 6 week(s) with Dr. Rockey Situ or APP   Signed, Loel Dubonnet, NP 07/07/2020, 9:30 AM [place]

## 2020-06-30 ENCOUNTER — Ambulatory Visit: Payer: 59 | Admitting: Physician Assistant

## 2020-07-07 ENCOUNTER — Encounter: Payer: Self-pay | Admitting: Family

## 2020-07-07 ENCOUNTER — Ambulatory Visit: Payer: 59 | Admitting: Family

## 2020-07-07 ENCOUNTER — Other Ambulatory Visit: Payer: Self-pay

## 2020-07-07 ENCOUNTER — Ambulatory Visit (INDEPENDENT_AMBULATORY_CARE_PROVIDER_SITE_OTHER): Payer: 59

## 2020-07-07 ENCOUNTER — Encounter: Payer: Self-pay | Admitting: Dermatology

## 2020-07-07 VITALS — BP 100/70 | HR 66 | Ht 64.0 in | Wt 163.0 lb

## 2020-07-07 DIAGNOSIS — R0600 Dyspnea, unspecified: Secondary | ICD-10-CM | POA: Diagnosis not present

## 2020-07-07 DIAGNOSIS — R002 Palpitations: Secondary | ICD-10-CM

## 2020-07-07 DIAGNOSIS — R42 Dizziness and giddiness: Secondary | ICD-10-CM

## 2020-07-07 DIAGNOSIS — I341 Nonrheumatic mitral (valve) prolapse: Secondary | ICD-10-CM

## 2020-07-07 NOTE — Patient Instructions (Addendum)
Medication Instructions:  No medication changes today.   *If you need a refill on your cardiac medications before your next appointment, please call your pharmacy*  Lab Work: None ordered today.  Testing/Procedures: Your physician has recommended that you wear a Zio monitor. Please remove on February 10th.  This monitor is a medical device that records the heart's electrical activity. Doctors most often use these monitors to diagnose arrhythmias. Arrhythmias are problems with the speed or rhythm of the heartbeat. The monitor is a small device applied to your chest. You can wear one while you do your normal daily activities. While wearing this monitor if you have any symptoms to push the button and record what you felt. Once you have worn this monitor for the period of time provider prescribed (Usually 14 days), you will return the monitor device in the postage paid box. Once it is returned they will download the data collected and provide [place] with a report which the provider will then review and we will call you with those results. Important tips:  1. Avoid showering during the first 24 hours of wearing the monitor. 2. Avoid excessive sweating to help maximize wear time. 3. Do not submerge the device, no hot tubs, and no swimming pools. 4. Keep any lotions or oils away from the patch. 5. After 24 hours you may shower with the patch on. Take brief showers with your back facing the shower head.  6. Do not remove patch once it has been placed because that will interrupt data and decrease adhesive wear time. 7. Push the button when you have any symptoms and write down what you were feeling. 8. Once you have completed wearing your monitor, remove and place into box which has postage paid and place in your outgoing mailbox.  9. If for some reason you have misplaced your box then call our office and we can provide another box and/or mail it off for you.      Your physician has requested that you  have an echocardiogram. Echocardiography is a painless test that uses sound waves to create images of your heart. It provides your doctor with information about the size and shape of your heart and how well your heart's chambers and valves are working. This procedure takes approximately one hour. There are no restrictions for this procedure.  Follow-Up: At [facility], you and your health needs are our priority.  As part of our continuing mission to provide you with exceptional heart care, we have created designated Provider Care Teams.  These Care Teams include your primary Cardiologist (physician) and Advanced Practice Providers (APPs -  Physician Assistants and Nurse Practitioners) who all work together to provide you with the care you need, when you need it.  We recommend signing up for the patient portal called "MyChart".  Sign up information is provided on this After Visit Summary.  MyChart is used to connect with patients for Virtual Visits (Telemedicine).  Patients are able to view lab/test results, encounter notes, upcoming appointments, etc.  Non-urgent messages can be sent to your provider as well.   To learn more about what you can do with MyChart, go to [email or web address].    Your next appointment:   6 week(s)  The format for your next appointment:   In Person  Provider:   You may see Ida Rogue, MD or one of the following Advanced Practice Providers on your designated Care Team:    Murray Hodgkins, NP  Christell Faith, PA-C  Malachi Bonds  Mickle Plumb, PA-C  Cadence Furth, PA-C  Laurann Montana, NP  Other Instructions   Palpitations Palpitations are feelings that your heartbeat is not normal. Your heartbeat may feel like it is:  Uneven.  Faster than normal.  Fluttering.  Skipping a beat. This is usually not a serious problem. In some cases, you may need tests to rule out any serious problems. Follow these instructions at home: Pay attention to any changes in your  condition. Take these actions to help manage your symptoms: Eating and drinking  Avoid: ? Coffee, tea, soft drinks, and energy drinks. ? Chocolate. ? Alcohol. ? Diet pills. Lifestyle  Try to lower your stress. These things can help you relax: ? Yoga. ? Deep breathing and meditation. ? Exercise. ? Using words and images to create positive thoughts (guided imagery). ? Using your mind to control things in your body (biofeedback).  Do not use drugs.  Get plenty of rest and sleep. Keep a regular bed time.   General instructions  Take over-the-counter and prescription medicines only as told by your doctor.  Do not use any products that contain nicotine or tobacco, such as cigarettes and e-cigarettes. If you need help quitting, ask your doctor.  Keep all follow-up visits as told by your doctor. This is important. You may need more tests if palpitations do not go away or get worse.   Contact a doctor if:  Your symptoms last more than 24 hours.  Your symptoms occur more often. Get help right away if you:  Have chest pain.  Feel short of breath.  Have a very bad headache.  Feel dizzy.  Pass out (faint). Summary  Palpitations are feelings that your heartbeat is uneven or faster than normal. It may feel like your heart is fluttering or skipping a beat.  Avoid food and drinks that may cause palpitations. These include caffeine, chocolate, and alcohol.  Try to lower your stress. Do not smoke or use drugs.  Get help right away if you faint or have chest pain, shortness of breath, a severe headache, or dizziness. This information is not intended to replace advice given to you by your health care provider. Make sure you discuss any questions you have with your health care provider. Document Revised: 07/10/2017 Document Reviewed: 07/10/2017 Elsevier Patient Education  2021 [employer].

## 2020-07-12 ENCOUNTER — Encounter: Payer: Self-pay | Admitting: Family

## 2020-07-15 ENCOUNTER — Other Ambulatory Visit: Payer: Self-pay | Admitting: Family

## 2020-07-15 DIAGNOSIS — K219 Gastro-esophageal reflux disease without esophagitis: Secondary | ICD-10-CM

## 2020-07-22 ENCOUNTER — Ambulatory Visit (INDEPENDENT_AMBULATORY_CARE_PROVIDER_SITE_OTHER): Payer: 59

## 2020-07-22 ENCOUNTER — Other Ambulatory Visit: Payer: Self-pay

## 2020-07-22 DIAGNOSIS — R002 Palpitations: Secondary | ICD-10-CM | POA: Diagnosis not present

## 2020-07-22 DIAGNOSIS — R0600 Dyspnea, unspecified: Secondary | ICD-10-CM

## 2020-07-22 DIAGNOSIS — I341 Nonrheumatic mitral (valve) prolapse: Secondary | ICD-10-CM

## 2020-07-22 LAB — ECHOCARDIOGRAM COMPLETE
AR max vel: 2.91 cm2
AV Area VTI: 2.8 cm2
AV Area mean vel: 2.98 cm2
AV Mean grad: 3 mmHg
AV Peak grad: 6.1 mmHg
Ao pk vel: 1.23 m/s
Area-P 1/2: 3.46 cm2
Calc EF: 53 %
S' Lateral: 3.3 cm
Single Plane A2C EF: 51.4 %
Single Plane A4C EF: 55.3 %

## 2020-07-26 ENCOUNTER — Other Ambulatory Visit: Payer: Self-pay | Admitting: Obstetrics & Gynecology

## 2020-07-26 DIAGNOSIS — N6310 Unspecified lump in the right breast, unspecified quadrant: Secondary | ICD-10-CM

## 2020-07-27 ENCOUNTER — Ambulatory Visit: Payer: Managed Care, Other (non HMO) | Admitting: Dermatology

## 2020-08-02 ENCOUNTER — Other Ambulatory Visit: Payer: Self-pay

## 2020-08-02 ENCOUNTER — Ambulatory Visit
Admission: RE | Admit: 2020-08-02 | Discharge: 2020-08-02 | Disposition: A | Discharge status: Home / Self Care | Payer: 59 | Source: Ambulatory Visit | Attending: Obstetrics & Gynecology | Admitting: Obstetrics & Gynecology

## 2020-08-02 DIAGNOSIS — N6310 Unspecified lump in the right breast, unspecified quadrant: Secondary | ICD-10-CM

## 2020-08-12 ENCOUNTER — Ambulatory Visit: Payer: 59 | Admitting: Family

## 2020-08-12 DIAGNOSIS — Z0289 Encounter for other administrative examinations: Secondary | ICD-10-CM

## 2020-08-21 NOTE — Progress Notes (Signed)
Cardiology Office Note  Date:  08/22/2020   ID:  Kiara Rich, DOB: 20-Apr-1986, MRN: [id number]  PCP:  Burnard Hawthorne, FNP   Chief Complaint  Patient presents with  . Follow-up    F/U after cardiac testing    HPI:  Ms. Kiara Rich is a 35 y.o. female with a PMHx of: Mild mitral Valve Prolapse (MVP) mild regurg GERD  history of heart disease, MI and HTN. Remote smoker Who presents for follow-up of her sharp left chest pain, dizziness  LOV  07/07/2020 Last seen by myself February 2020  on last clinic visit she reported  occasional SOB, occasional sharp left chest pain, periodic dizziness.  Possible anxiety, occasional sharp pains  are very rare. She used to smoke many years ago.   Echo 07/22/2020 1. Left ventricular ejection fraction, by estimation, is 55 %. The left  ventricle has normal function.  4. Unable to exclude mild mitral valve prolapse of the anterior leaflet,  trace mitral valve regurgitation.   Continues to have some dizziness when she moves her head around even in bed I, has been followed by ENT, concern for vertigo Periodically takes meclizine  Blood pressure low, no significant orthostasis Weight down 10 pounds, intentional She denies syncope or near syncope  Total cholesterol 130 LDL 70  Prior EKG reviewed Shows normal rhythm   OTHER PAST MEDICAL HISTORY REVIEWED BY ME FOR TODAY'S VISIT:   PMH:   has a past medical history of Anxiety, Frequent headaches, GERD (gastroesophageal reflux disease), and Mitral valve prolapse.  PSH:    Past Surgical History:  Procedure Laterality Date  . LEEP  2012   Precancerous cells    Current Outpatient Medications  Medication Sig Dispense Refill  . Multiple Vitamin (MULTIVITAMIN ADULT PO) Take 1 tablet by mouth daily.     No current facility-administered medications for this visit.     ALLERGIES:   Penicillins   SOCIAL HISTORY:  The patient  reports that she quit smoking about 9 years ago. Her  smoking use included cigarettes. She has never used smokeless tobacco. She reports that she does not drink alcohol and does not use drugs.   FAMILY HISTORY:   family history includes Asthma in her mother; Diabetes in her father, maternal grandfather, maternal grandmother, mother, paternal grandfather, paternal grandmother, and paternal uncle; Hearing loss in her father; Heart attack in her mother; Heart disease in her mother; Hypertension in her mother; Kidney disease in her mother; Miscarriages / Stillbirths in her mother; Uterine cancer in her maternal grandmother and paternal aunt.    REVIEW OF SYSTEMS: Review of Systems  Constitutional: Negative.   HENT: Negative.   Eyes: Negative.   Respiratory: Negative.   Cardiovascular: Negative.   Gastrointestinal: Negative.   Genitourinary: Negative.   Musculoskeletal: Negative.   Neurological: Negative.   Psychiatric/Behavioral: Negative.   All other systems reviewed and are negative.    PHYSICAL EXAM: VS:  BP 100/70 (BP Location: Left Arm, Patient Position: Sitting, Cuff Size: Normal)   Pulse 62   Ht 5\' 4"  (1.626 m)   Wt 162 lb (73.5 kg)   LMP 08/02/2020 (Exact Date)   SpO2 98%   BMI 27.81 kg/m  , BMI Body mass index is 27.81 kg/m. Constitutional:  oriented to person, place, and time. No distress.  HENT:  Head: Grossly normal Eyes:  no discharge. No scleral icterus.  Neck: No JVD, no carotid bruits  Cardiovascular: Regular rate and rhythm, no murmurs appreciated Pulmonary/Chest: Clear to auscultation  bilaterally, no wheezes or rails Abdominal: Soft.  no distension.  no tenderness.  Musculoskeletal: Normal range of motion Neurological:  normal muscle tone. Coordination normal. No atrophy Skin: Skin warm and dry Psychiatric: normal affect, pleasant   RECENT LABS: 03/10/2020: ALT 11; BUN 11; Creatinine, Ser 0.74; Hemoglobin 12.9; Platelets 211.0; Potassium 3.9; Sodium 137; TSH 1.26    LIPID PANEL: Lab Results  Component  Value Date   CHOL 130 03/10/2020   HDL 42.50 03/10/2020   LDLCALC 70 03/10/2020   TRIG 84.0 03/10/2020      WEIGHT: Wt Readings from Last 3 Encounters:  08/22/20 162 lb (73.5 kg)  07/07/20 163 lb (73.9 kg)  01/18/20 170 lb 6.4 oz (77.3 kg)      ASSESSMENT AND PLAN:  MVP (mitral valve prolapse)  Echo with no significant prolapse No murmur on exam No further work-up  Dizziness Suspected to be vertigo, Working with ENT Blood pressure is low, recommend she stay hydrated, not avoid salt Compression hose if needed  Left sharp chest pain Noted previously, felt to be musculoskeletal, no further work-up needed  Low blood pressure Recommended hydration, do not avoid salt Compression hose Rise from a supine position slowly Weight down 10 pounds from prior visit with me, cautious weight loss going forward   Disposition:   F/U  PRN   Total encounter time more than 15 minutes  Greater than 50% was spent in counseling and coordination of care with the patient    No orders of the defined types were placed in this encounter.    Signed, Esmond Plants, M.D., Ph.D. 08/22/2020  [place], [place]

## 2020-08-22 ENCOUNTER — Encounter: Payer: Self-pay | Admitting: Cardiovascular Disease

## 2020-08-22 ENCOUNTER — Other Ambulatory Visit: Payer: Self-pay

## 2020-08-22 ENCOUNTER — Ambulatory Visit (INDEPENDENT_AMBULATORY_CARE_PROVIDER_SITE_OTHER): Payer: 59 | Admitting: Cardiovascular Disease

## 2020-08-22 VITALS — BP 100/70 | HR 62 | Ht 64.0 in | Wt 162.0 lb

## 2020-08-22 DIAGNOSIS — I341 Nonrheumatic mitral (valve) prolapse: Secondary | ICD-10-CM | POA: Diagnosis not present

## 2020-08-22 DIAGNOSIS — R002 Palpitations: Secondary | ICD-10-CM

## 2020-08-22 DIAGNOSIS — R0600 Dyspnea, unspecified: Secondary | ICD-10-CM | POA: Diagnosis not present

## 2020-08-22 DIAGNOSIS — R42 Dizziness and giddiness: Secondary | ICD-10-CM | POA: Diagnosis not present

## 2020-08-22 NOTE — Patient Instructions (Addendum)
Medication Instructions:  No changes  If you need a refill on your cardiac medications before your next appointment, please call your pharmacy.    Lab work: No new labs needed   If you have labs (blood work) drawn today and your tests are completely normal, you will receive your results only by: . MyChart Message (if you have MyChart) OR . A paper copy in the mail If you have any lab test that is abnormal or we need to change your treatment, we will call you to review the results.   Testing/Procedures: No new testing needed   Follow-Up: At [facility], you and your health needs are our priority.  As part of our continuing mission to provide you with exceptional heart care, we have created designated Provider Care Teams.  These Care Teams include your primary Cardiologist (physician) and Advanced Practice Providers (APPs -  Physician Assistants and Nurse Practitioners) who all work together to provide you with the care you need, when you need it.  . You will need a follow up appointment as needed  . Providers on your designated Care Team:   . Christopher Berge, NP . Ryan Dunn, PA-C . Jacquelyn Visser, PA-C  Any Other Special Instructions Will Be Listed Below (If Applicable).  COVID-19 Vaccine Information can be found at: [email or web address] For questions related to vaccine distribution or appointments, please email [email or web address] or call [phone].     

## 2020-10-12 ENCOUNTER — Encounter: Payer: Self-pay | Admitting: Family

## 2020-10-18 ENCOUNTER — Other Ambulatory Visit: Payer: Self-pay

## 2020-10-18 ENCOUNTER — Encounter: Payer: Self-pay | Admitting: Dermatology

## 2020-10-18 DIAGNOSIS — R21 Rash and other nonspecific skin eruption: Secondary | ICD-10-CM

## 2020-10-18 MED ORDER — TRIAMCINOLONE ACETONIDE 0.1 % EX OINT: TOPICAL_OINTMENT | CUTANEOUS | 0 refills | Status: AC

## 2020-10-25 ENCOUNTER — Ambulatory Visit: Payer: 59 | Admitting: Dermatology

## 2020-10-25 ENCOUNTER — Other Ambulatory Visit: Payer: Self-pay

## 2020-10-25 DIAGNOSIS — L219 Seborrheic dermatitis, unspecified: Secondary | ICD-10-CM

## 2020-10-25 DIAGNOSIS — L308 Other specified dermatitis: Secondary | ICD-10-CM | POA: Diagnosis not present

## 2020-10-25 MED ORDER — CLOBETASOL PROPIONATE 0.05 % EX FOAM: Freq: Two times a day (BID) | CUTANEOUS | 1 refills | Status: DC

## 2020-10-25 MED ORDER — KETOCONAZOLE 2 % EX SHAM: MEDICATED_SHAMPOO | CUTANEOUS | 2 refills | Status: DC

## 2020-10-25 NOTE — Progress Notes (Signed)
   Follow-Up Visit   Subjective  Kiara Rich is a 35 y.o. female who presents for the following: Rash (Check a itchy rash on the chest, arms ~11 days, treating with Triamcinolone ointment with a fair response ). Pt c/o itchy scaly scalp she would like checked today.  The following portions of the chart were reviewed this encounter and updated as appropriate:   Tobacco  Allergies  Meds  Problems  Med Hx  Surg Hx  Fam Hx      Review of Systems:  No other skin or systemic complaints except as noted in HPI or Assessment and Plan.  Objective  Well appearing patient in no apparent distress; mood and affect are within normal limits.  A focused examination was performed including face, scalp, chest, arms. Relevant physical exam findings are noted in the Assessment and Plan.  Objective  arms, chest: Erythematous papules   Objective  scalp: Erythema and scale   Assessment & Plan  Other eczema arms, chest  Start Clobetasol foam apply to skin apply twice daily as needed to affected areas for two weeks then apply twice daily on weekends only    Avoid applying to face, groin, and axilla. Use as directed. Risk of skin atrophy with long-term use reviewed.    Topical steroids (such as triamcinolone, fluocinolone, fluocinonide, mometasone, clobetasol, halobetasol, betamethasone, hydrocortisone) can cause thinning and lightening of the skin if they are used for too long in the same area. Your physician has selected the right strength medicine for your problem and area affected on the body. Please use your medication only as directed by your physician to prevent side effects.    If no better after 2 weeks return to the clinic we may consider biopsy or patch testing   Reordered Medications clobetasol (OLUX) 0.05 % topical foam  Seborrheic dermatitis scalp  Chronic condition with duration or expected duration over one year. Condition is bothersome to patient. Currently  flared.  Seborrheic Dermatitis  -  is a chronic persistent rash characterized by pinkness and scaling most commonly of the mid face but also can occur on the scalp (dandruff), ears; mid chest and mid back. It tends to be exacerbated by stress and cooler weather.  People who have neurologic disease may experience new onset or exacerbation of existing seborrheic dermatitis.  The condition is not curable but treatable and can be controlled.   Start Ketoconazole 2% shampoo apply three times per week, massage into scalp and leave in for 10 minutes before rinsing out   Ordered Medications: ketoconazole (NIZORAL) 2 % shampoo  Return in about 6 weeks (around 12/06/2020) for eczema, seborrheic dermatitis .  I, Marye Round, CMA, am acting as scribe for Forest Gleason, MD .  Documentation: I have reviewed the above documentation for accuracy and completeness, and I agree with the above.  Forest Gleason, MD

## 2020-10-25 NOTE — Patient Instructions (Addendum)
If you have any questions or concerns for your doctor, please call our main line at [phone] and press option 4 to reach your doctor's medical assistant. If no one answers, please leave a voicemail as directed and we will return your call as soon as possible. Messages left after 4 pm will be answered the following business day.   You may also send [place] a message via [place]. We typically respond to MyChart messages within 1-2 business days.  For prescription refills, please ask your pharmacy to contact our office. Our fax number is [phone].  If you have an urgent issue when the clinic is closed that cannot wait until the next business day, you can page your doctor at the number below.    Please note that while we do our best to be available for urgent issues outside of office hours, we are not available 24/7.   If you have an urgent issue and are unable to reach [place], you may choose to seek medical care at your doctor's office, retail clinic, urgent care center, or emergency room.  If you have a medical emergency, please immediately call 911 or go to the emergency department.  Pager Numbers  - Dr. Nehemiah Massed: [phone]  - Dr. Laurence Ferrari: [phone]  - Dr. Nicole Kindred: [phone]  In the event of inclement weather, please call our main line at [phone] for an update on the status of any delays or closures.  Dermatology Medication Tips: Please keep the boxes that topical medications come in in order to help keep track of the instructions about where and how to use these. Pharmacies typically print the medication instructions only on the boxes and not directly on the medication tubes.   If your medication is too expensive, please contact our office at [phone] option 4 or send [place] a message through [place].   We are unable to tell what your co-pay for medications will be in advance as this is different depending on your insurance coverage. However, we may be able to find a substitute  medication at lower cost or fill out paperwork to get insurance to cover a needed medication.   If a prior authorization is required to get your medication covered by your insurance company, please allow [place] 1-2 business days to complete this process.  Drug prices often vary depending on where the prescription is filled and some pharmacies may offer cheaper prices.  The website www.goodrx.com contains coupons for medications through different pharmacies. The prices here do not account for what the cost may be with help from insurance (it may be cheaper with your insurance), but the website can give you the price if you did not use any insurance.  - You can print the associated coupon and take it with your prescription to the pharmacy.  - You may also stop by our office during regular business hours and pick up a GoodRx coupon card.  - If you need your prescription sent electronically to a different pharmacy, notify our office through [facility] or by phone at [phone] option 4.  Gentle Skin Care Guide  1. Bathe no more than once a day.  2. Avoid bathing in hot water  3. Use a mild soap like Dove, Vanicream, Cetaphil, CeraVe. Can use Lever 2000 or Cetaphil antibacterial soap  4. Use soap only where you need it. On most days, use it under your arms, between your legs, and on your feet. Let the water rinse other areas unless visibly dirty.  5. When  you get out of the bath/shower, use a towel to gently blot your skin dry, don't rub it.  6. While your skin is still a little damp, apply a moisturizing cream such as Vanicream, CeraVe, Cetaphil, Eucerin, Sarna lotion or plain Vaseline Jelly. For hands apply Neutrogena [place] Hand Cream or Excipial Hand Cream.  7. Reapply moisturizer any time you start to itch or feel dry.  8. Sometimes using free and clear laundry detergents can be helpful. Fabric softener sheets should be avoided. Downy Free & Gentle liquid, or any liquid fabric  softener that is free of dyes and perfumes, it acceptable to use  9. If your doctor has given you prescription creams you may apply moisturizers over them    Topical steroids (such as triamcinolone, fluocinolone, fluocinonide, mometasone, clobetasol, halobetasol, betamethasone, hydrocortisone) can cause thinning and lightening of the skin if they are used for too long in the same area. Your physician has selected the right strength medicine for your problem and area affected on the body. Please use your medication only as directed by your physician to prevent side effects.

## 2020-10-30 ENCOUNTER — Encounter: Payer: Self-pay | Admitting: Dermatology

## 2020-12-06 ENCOUNTER — Ambulatory Visit: Payer: 59 | Admitting: Dermatology

## 2020-12-06 ENCOUNTER — Encounter: Payer: Self-pay | Admitting: Family

## 2020-12-30 ENCOUNTER — Ambulatory Visit: Payer: 59 | Admitting: Family

## 2020-12-30 ENCOUNTER — Other Ambulatory Visit: Payer: Self-pay

## 2020-12-30 ENCOUNTER — Encounter: Payer: Self-pay | Admitting: Family

## 2020-12-30 VITALS — BP 100/70 | HR 75 | Temp 98.5°F | Ht 64.0 in | Wt 163.2 lb

## 2020-12-30 DIAGNOSIS — R590 Localized enlarged lymph nodes: Secondary | ICD-10-CM

## 2020-12-30 MED ORDER — LORATADINE 10 MG PO TABS: 10.0000 mg | ORAL_TABLET | Freq: Every day | ORAL | 11 refills | Status: DC

## 2020-12-30 MED ORDER — FAMOTIDINE 20 MG PO TABS: 20.0000 mg | ORAL_TABLET | Freq: Every day | ORAL | 1 refills | Status: DC

## 2020-12-30 NOTE — Progress Notes (Signed)
Subjective:    Patient ID: Kiara Rich, female    DOB: 05-22-1986, 35 y.o.   MRN: [id number]  CC: Kiara Rich is a 35 y.o. female who presents today for an acute visit.    HPI: Left sided supraclavicular lymph nodes, enlarged since 7 months since she had covid.  Questions whether there are two and new lymph node.   Nontender unless she is palpating her neck a lot.  At times, 'feels like it is inflamed'.   No fever, night sweats, bone pain.    Left sided throat pain 'forever' , 2 years, worsening since 6 months after covid. No trouble swallowing, choking, hoarseness.  Endorses throat clearing, PND. Had been claritin. Comes and goes.   H/o seasonal allergies   She would like labs drawn today as well.     Saw Dr Pryor Ochoa earlier this year ( no record in chart). She had discussed having her tonsils out.    CT head and neck 09/02/19 -asymmetric lymph node identified left 72m.  Findings are nonspecific.  No pathologically enlarged or suspicious lymph nodes demonstrated elsewhere within the neck  Consult with Dr BRedmond BasemanENT he reassured patient and advised she can follow-up as needed  Per Dr WLeonides Schanz Last pap smear: 2021 neg/HPV+ , colpo benign no biopsies  HISTORY:  Past Medical History:  Diagnosis Date   Anxiety    Frequent headaches    GERD (gastroesophageal reflux disease)    Mitral valve prolapse    Mild   Past Surgical History:  Procedure Laterality Date   LEEP  2012   Precancerous cells   Family History  Problem Relation Age of Onset   Uterine cancer Maternal Grandmother    Diabetes Maternal Grandmother    Diabetes Father    Hearing loss Father    Diabetes Mother    Heart attack Mother    Asthma Mother    Heart disease Mother    Hypertension Mother    Kidney disease Mother    Miscarriages / [place]    Uterine cancer Paternal Aunt    Diabetes Paternal Uncle    Diabetes Paternal Grandfather    Diabetes Paternal Grandmother    Diabetes  Maternal Grandfather     Allergies: Penicillins Current Outpatient Medications on File Prior to Visit  Medication Sig Dispense Refill   clobetasol (OLUX) 0.05 % topical foam Apply topically 2 (two) times daily. apply twice daily as needed to affected areas for two weeks then apply twice daily on weekends only. Avoid applying to face, groin, and axilla. Use as directed. Risk of skin atrophy with long-term use reviewed. 50 g 1   Multiple Vitamin (MULTIVITAMIN ADULT PO) Take 1 tablet by mouth daily.     triamcinolone ointment (KENALOG) 0.1 % apply to affected areas twice a day for 2 weeks. Avoid applying to face, groin, and axilla. Use as directed. Risk of skin atrophy with long-term use reviewed. 80 g 0   ketoconazole (NIZORAL) 2 % shampoo apply three times per week, massage into scalp and leave in for 10 minutes before rinsing out (Patient not taking: Reported on 12/30/2020) 120 mL 2   No current facility-administered medications on file prior to visit.    Social History   Tobacco Use   Smoking status: Former    Types: Cigarettes    Quit date: 06/12/2011    Years since quitting: 9.5   Smokeless tobacco: Never  Vaping Use   Vaping Use: Never used  Substance Use Topics  Alcohol use: No   Drug use: No    Review of Systems  Constitutional:  Negative for chills and fever.  HENT:  Positive for postnasal drip. Negative for sore throat, trouble swallowing and voice change.   Respiratory:  Negative for cough.   Cardiovascular:  Negative for chest pain and palpitations.  Gastrointestinal:  Negative for abdominal pain, nausea and vomiting.     Objective:    BP 100/70 (BP Location: Left Arm, Patient Position: Sitting, Cuff Size: Large)   Pulse 75   Temp 98.5 F (36.9 C) (Oral)   Ht '5\' 4"'$  (1.626 m)   Wt 163 lb 3.2 oz (74 kg)   SpO2 98%   BMI 28.01 kg/m   Filed Weights   12/30/20 1436  Weight: 163 lb 3.2 oz (74 kg)   Wt Readings from Last 3 Encounters:  12/30/20 163 lb 3.2 oz (74  kg)  08/22/20 162 lb (73.5 kg)  07/07/20 163 lb (73.9 kg)    Physical Exam Vitals reviewed.  Constitutional:      Appearance: She is well-developed.  HENT:     Head: Normocephalic and atraumatic.     Comments: Tonsils 4/4    Right Ear: Hearing, tympanic membrane, ear canal and external ear normal. No decreased hearing noted. No drainage, swelling or tenderness. No middle ear effusion. No foreign body. Tympanic membrane is not erythematous or bulging.     Left Ear: Hearing, tympanic membrane, ear canal and external ear normal. No decreased hearing noted. No drainage, swelling or tenderness.  No middle ear effusion. No foreign body. Tympanic membrane is not erythematous or bulging.     Nose: Nose normal. No rhinorrhea.     Right Sinus: No maxillary sinus tenderness or frontal sinus tenderness.     Left Sinus: No maxillary sinus tenderness or frontal sinus tenderness.     Mouth/Throat:     Pharynx: Uvula midline. No oropharyngeal exudate or posterior oropharyngeal erythema.     Tonsils: No tonsillar abscesses.  Eyes:     Conjunctiva/sclera: Conjunctivae normal.  Cardiovascular:     Rate and Rhythm: Regular rhythm.     Pulses: Normal pulses.     Heart sounds: Normal heart sounds.  Pulmonary:     Effort: Pulmonary effort is normal.     Breath sounds: Normal breath sounds. No wheezing, rhonchi or rales.  Lymphadenopathy:     Head:     Right side of head: No submental, submandibular, tonsillar, preauricular, posterior auricular or occipital adenopathy.     Left side of head: No submental, submandibular, tonsillar, preauricular, posterior auricular or occipital adenopathy.     Cervical: No cervical adenopathy.  Skin:    General: Skin is warm and dry.  Neurological:     Mental Status: She is alert.  Psychiatric:        Speech: Speech normal.        Behavior: Behavior normal.        Thought Content: Thought content normal.       Assessment & Plan:   Problem List Items Addressed  This Visit       Immune and Lymphatic   Enlarged lymph node in neck - Primary    Benign exam.  Unable to palpate lymphadenopathy.  Pending CT soft tissue head neck.  Advised patient to ensure she aranges to follow-up with Dr. Pryor Ochoa, ENT discussed size of tonsils as well as CT head neck if lymph node is still present as it was 2021.  Discussed trial of  Pepcid AC, and Claritin for suspected postnasal drip from allergies and possible GERD symptoms as it relates to throat clearing.  Close follow-up.       Relevant Medications   famotidine (PEPCID) 20 MG tablet   loratadine (CLARITIN) 10 MG tablet   Other Relevant Orders   CT SOFT TISSUE NECK W CONTRAST   TSH   CBC with Differential/Platelet   Comprehensive metabolic panel   Hemoglobin A1c   Hepatitis C antibody   HIV Antibody (routine testing w rflx)   VITAMIN D 25 Hydroxy (Vit-D Deficiency, Fractures)       I am having Lattie Haw R. Wubben start on famotidine and loratadine. I am also having her maintain her Multiple Vitamin (MULTIVITAMIN ADULT PO), triamcinolone ointment, ketoconazole, and clobetasol.   Meds ordered this encounter  Medications   famotidine (PEPCID) 20 MG tablet    Sig: Take 1 tablet (20 mg total) by mouth at bedtime.    Dispense:  30 tablet    Refill:  1    Order Specific Question:   Supervising Provider    Answer:   Deborra Medina L [[id number]]   loratadine (CLARITIN) 10 MG tablet    Sig: Take 1 tablet (10 mg total) by mouth daily.    Dispense:  30 tablet    Refill:  11    Order Specific Question:   Supervising Provider    Answer:   Crecencio Mc [[id number]]    Return precautions given.   Risks, benefits, and alternatives of the medications and treatment plan prescribed today were discussed, and patient expressed understanding.   Education regarding symptom management and diagnosis given to patient on AVS.  Continue to follow with Burnard Hawthorne, FNP for routine health maintenance.   Gennette Pac and I  agreed with plan.   Mable Paris, FNP

## 2020-12-30 NOTE — Assessment & Plan Note (Signed)
Benign exam.  Unable to palpate lymphadenopathy.  Pending CT soft tissue head neck.  Advised patient to ensure she aranges to follow-up with Dr. Pryor Ochoa, ENT discussed size of tonsils as well as CT head neck if lymph node is still present as it was 2021.  Discussed trial of Pepcid AC, and Claritin for suspected postnasal drip from allergies and possible GERD symptoms as it relates to throat clearing.  Close follow-up.

## 2020-12-30 NOTE — Patient Instructions (Signed)
Start pepcid ac and claritin  CT neck   Let us know if you dont hear back within a week in regards to an appointment being scheduled.   Please call dr Pryor Ochoa and schedule a follow up as well

## 2021-01-02 ENCOUNTER — Encounter: Payer: Self-pay | Admitting: Family

## 2021-01-02 LAB — CBC WITH DIFFERENTIAL/PLATELET
Absolute Monocytes: 531 cells/uL (ref 200–950)
Basophils Absolute: 27 cells/uL (ref 0–200)
Basophils Relative: 0.3 %
Eosinophils Absolute: 117 cells/uL (ref 15–500)
Eosinophils Relative: 1.3 %
HCT: 39.7 % (ref 35.0–45.0)
Hemoglobin: 13.6 g/dL (ref 11.7–15.5)
Lymphs Abs: 2133 cells/uL (ref 850–3900)
MCH: 31.3 pg (ref 27.0–33.0)
MCHC: 34.3 g/dL (ref 32.0–36.0)
MCV: 91.3 fL (ref 80.0–100.0)
MPV: 10.6 fL (ref 7.5–12.5)
Monocytes Relative: 5.9 %
Neutro Abs: 6192 cells/uL (ref 1500–7800)
Neutrophils Relative %: 68.8 %
Platelets: 256 10*3/uL (ref 140–400)
RBC: 4.35 10*6/uL (ref 3.80–5.10)
RDW: 12.1 % (ref 11.0–15.0)
Total Lymphocyte: 23.7 %
WBC: 9 10*3/uL (ref 3.8–10.8)

## 2021-01-02 LAB — HEPATITIS C ANTIBODY
Hepatitis C Ab: NONREACTIVE
SIGNAL TO CUT-OFF: 0.01 (ref <1.00)

## 2021-01-02 LAB — COMPREHENSIVE METABOLIC PANEL
AG Ratio: 1.6 (calc) (ref 1.0–2.5)
ALT: 21 U/L (ref 6–29)
AST: 13 U/L (ref 10–30)
Albumin: 4.9 g/dL (ref 3.6–5.1)
Alkaline phosphatase (APISO): 71 U/L (ref 31–125)
BUN: 13 mg/dL (ref 7–25)
CO2: 26 mmol/L (ref 20–32)
Calcium: 10 mg/dL (ref 8.6–10.2)
Chloride: 102 mmol/L (ref 98–110)
Creat: 0.75 mg/dL (ref 0.50–0.97)
Globulin: 3 g/dL (calc) (ref 1.9–3.7)
Glucose, Bld: 84 mg/dL (ref 65–99)
Potassium: 4.1 mmol/L (ref 3.5–5.3)
Sodium: 139 mmol/L (ref 135–146)
Total Bilirubin: 0.5 mg/dL (ref 0.2–1.2)
Total Protein: 7.9 g/dL (ref 6.1–8.1)

## 2021-01-02 LAB — HEMOGLOBIN A1C
Hgb A1c MFr Bld: 5.2 % of total Hgb (ref <5.7)
Mean Plasma Glucose: 103 mg/dL
eAG (mmol/L): 5.7 mmol/L

## 2021-01-02 LAB — VITAMIN D 25 HYDROXY (VIT D DEFICIENCY, FRACTURES): Vit D, 25-Hydroxy: 40 ng/mL (ref 30–100)

## 2021-01-02 LAB — HIV ANTIBODY (ROUTINE TESTING W REFLEX): HIV 1&2 Ab, 4th Generation: NONREACTIVE

## 2021-01-02 LAB — TSH: TSH: 1.31 mIU/L

## 2021-01-03 ENCOUNTER — Other Ambulatory Visit: Payer: Self-pay

## 2021-01-03 ENCOUNTER — Ambulatory Visit
Admission: RE | Admit: 2021-01-03 | Discharge: 2021-01-03 | Disposition: A | Discharge status: Home / Self Care | Payer: 59 | Source: Ambulatory Visit | Attending: Family | Admitting: Family

## 2021-01-03 DIAGNOSIS — R590 Localized enlarged lymph nodes: Secondary | ICD-10-CM | POA: Diagnosis present

## 2021-01-03 MED ORDER — IOHEXOL 350 MG/ML SOLN
75.0000 mL | Freq: Once | INTRAVENOUS | Status: AC | PRN
  Administered 2021-01-03: 75 mL via INTRAVENOUS

## 2021-02-10 ENCOUNTER — Ambulatory Visit: Payer: 59 | Admitting: Family

## 2021-03-24 IMAGING — US US BREAST*R* LIMITED INC AXILLA
1 series · 12 of 12 positions shown · non-contrast
Comparison: None.

CLINICAL DATA: 34-year-old female presenting for evaluation of a
palpable lump in the lower outer quadrant of the right breast.
Additionally, at the time of ultrasound today, the patient mentioned
that she has had an area of palpable thickening in the upper-outer
quadrant of the right breast.

EXAM:
DIGITAL DIAGNOSTIC BILATERAL MAMMOGRAM WITH TOMOSYNTHESIS AND CAD;
ULTRASOUND RIGHT BREAST LIMITED
TECHNIQUE: Bilateral digital diagnostic mammography and breast tomosynthesis
was performed. The images were evaluated with computer-aided
detection.; Targeted ultrasound examination of the right breast was
performed

[Series 1: us breast*right* limited inc axilla · 0.06mm/px · 12 of 12 slices shown]
[im 1/12]
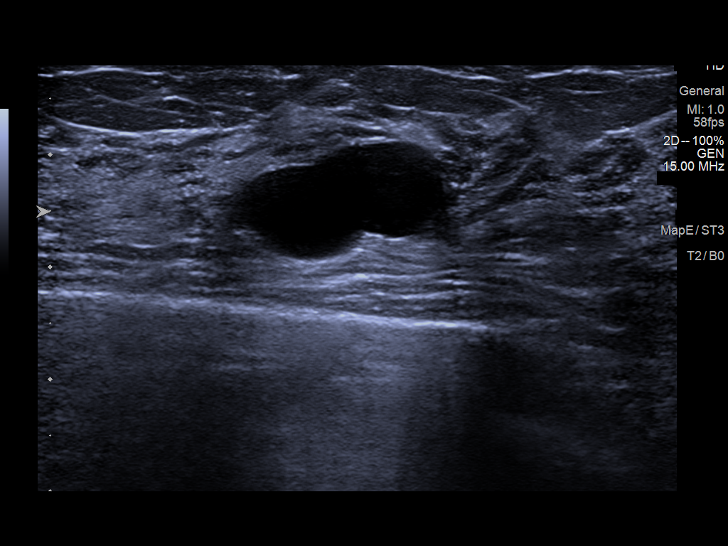
[im 2/12]
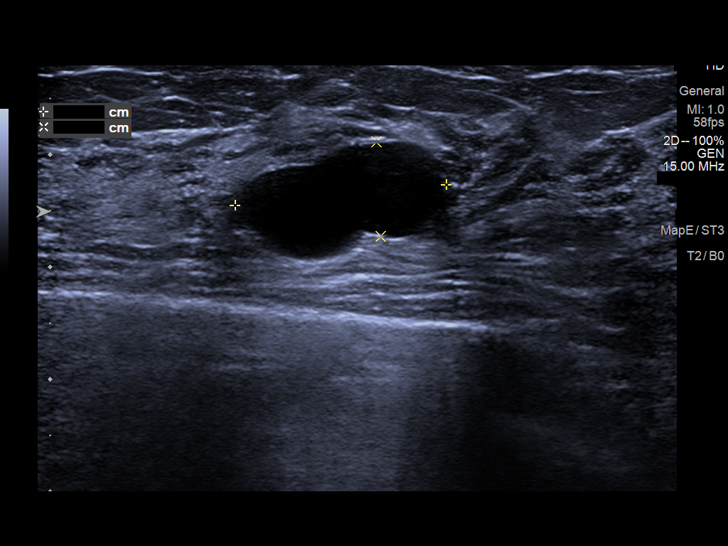
[im 3/12]
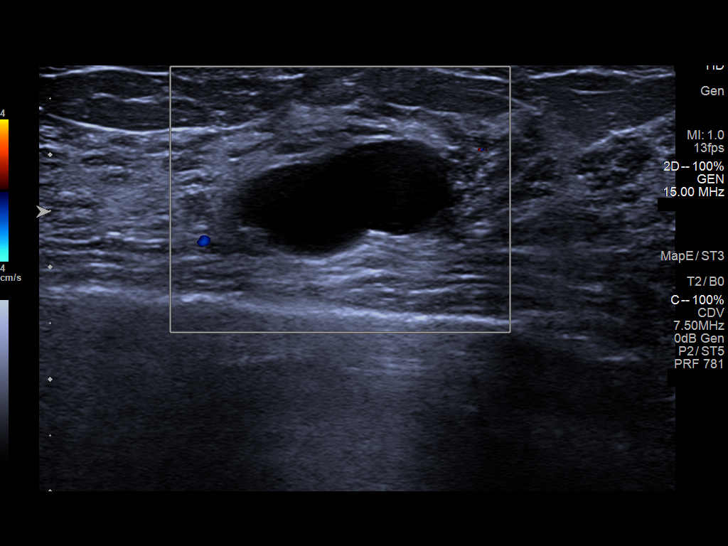
[im 4/12]
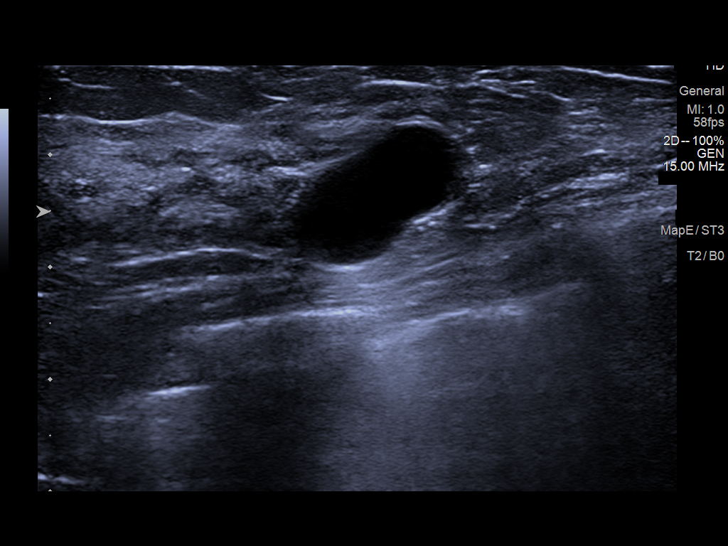
[im 5/12]
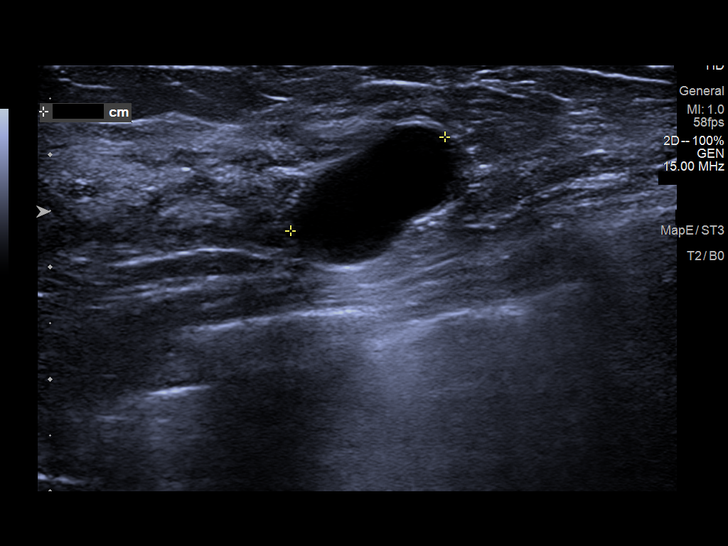
[im 6/12]
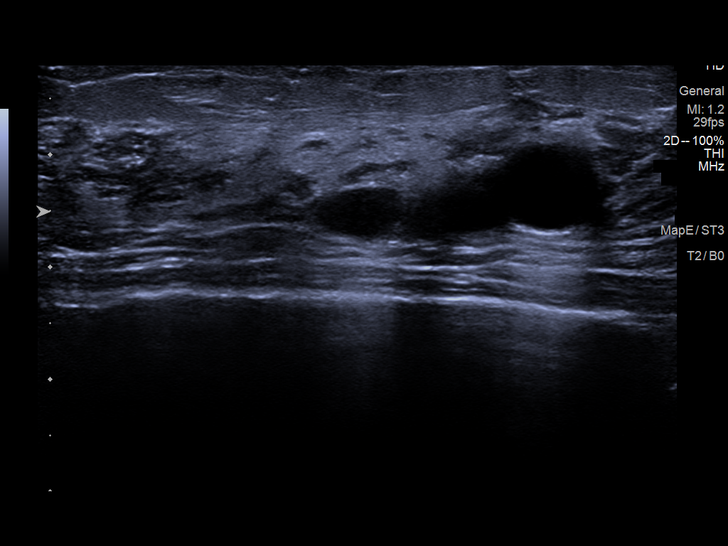
[im 7/12]
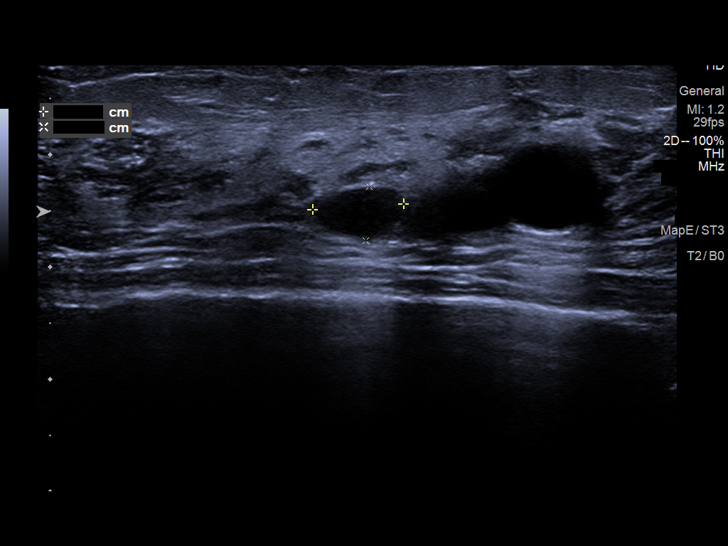
[im 8/12]
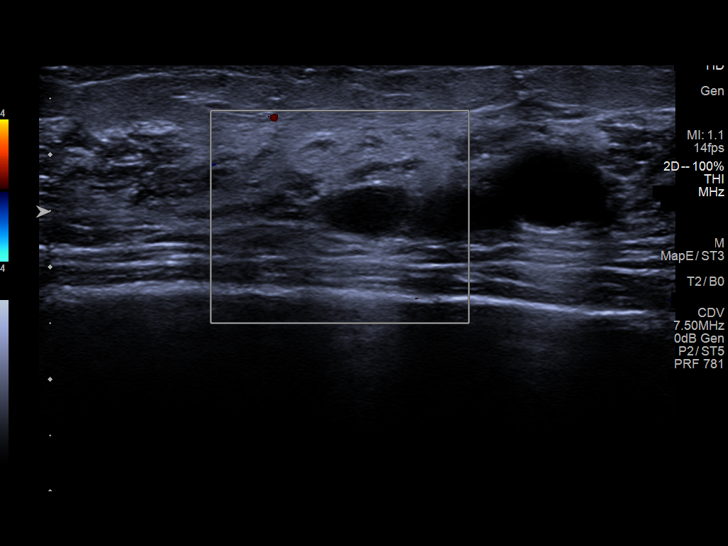
[im 9/12]
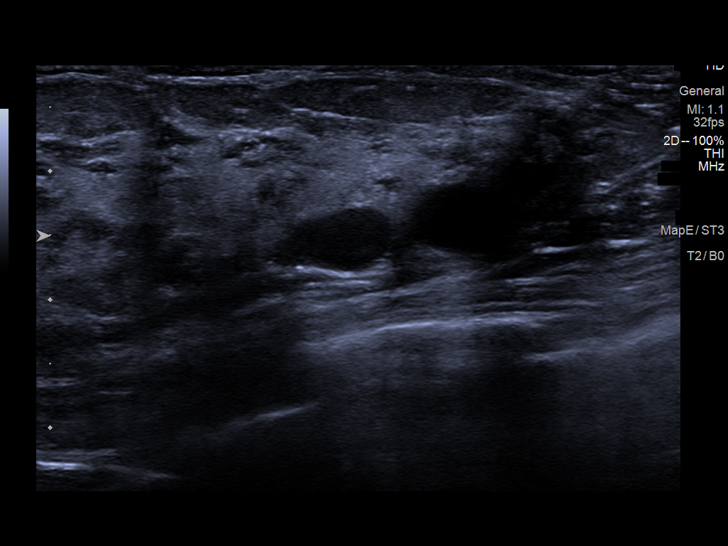
[im 10/12]
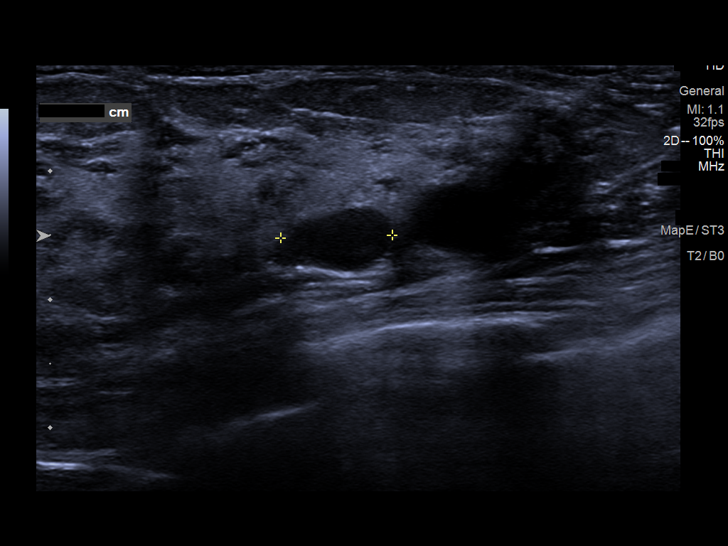
[im 11/12]
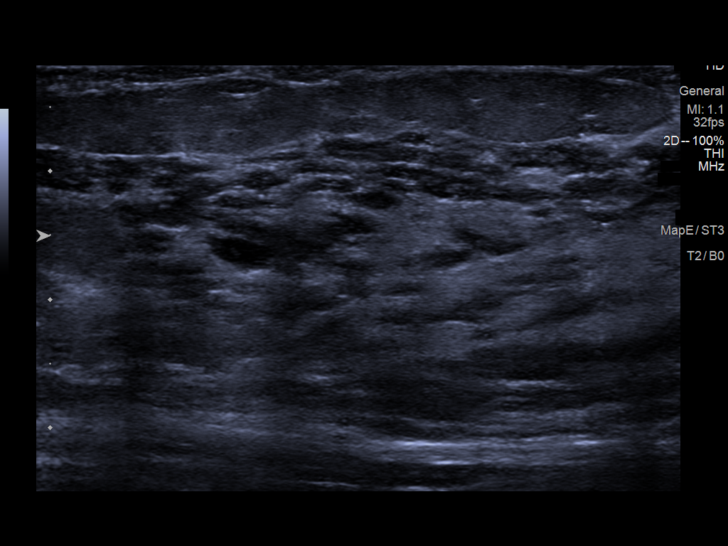
[im 12/12]
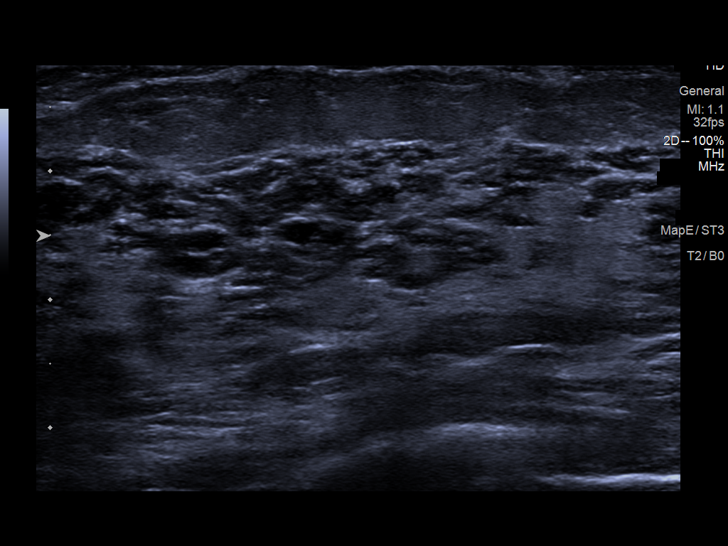

[12 of 12 positions shown; findings below may reference images not displayed]

ACR Breast Density Category d: The breast tissue is extremely dense,
which lowers the sensitivity of mammography.
FINDINGS: A BB indicating the palpable site of concern has been placed on the
lower outer quadrant of the right breast. Deep to the palpable
marker there is an obscured oval mass. No suspicious calcifications,
other masses or areas of distortion are seen in the bilateral
breasts.

Physical exam targeted to the palpable site of thickening in the
upper-outer right breast demonstrates a firm ridge of tissue between
10 and 11 o'clock. No discrete mass is palpated.

Ultrasound targeted to the palpable site in the right breast at 7
o'clock, 4 cm from the nipple demonstrates a bilobed anechoic oval
circumscribed mass measuring 1.9 x 0.8 x 1.6 cm. There is are a few
smaller benign-appearing cysts adjacent to this larger cyst.
Ultrasound targeted to the upper-outer quadrant of the right breast
demonstrates dense heterogeneous fibroglandular tissue with small
scattered cysts. No suspicious masses or areas of shadowing are
identified.
IMPRESSION: 1. The palpable site in the right breast at 7 o'clock corresponds
with a benign cyst.

2. The palpable area of thickening in the upper-outer quadrant of
the right breast corresponds with normal dense fibroglandular
tissue.

3.  No evidence of malignancy in the bilateral breasts.

RECOMMENDATION:
1. Clinical follow-up recommended for the palpable thickened area of
concern in the upper-outer right breast. Any further workup should
be based on clinical grounds.

2. Screening mammogram at age 39 unless there are persistent or
intervening clinical concerns. (Code:7M-3-LAQ)

I have discussed the findings and recommendations with the patient.
If applicable, a reminder letter will be sent to the patient
regarding the next appointment.

BI-RADS CATEGORY  2: Benign.

## 2021-04-26 ENCOUNTER — Encounter: Payer: Managed Care, Other (non HMO) | Admitting: Dermatology

## 2021-09-14 ENCOUNTER — Encounter: Payer: Self-pay | Admitting: Family

## 2021-09-14 ENCOUNTER — Ambulatory Visit (INDEPENDENT_AMBULATORY_CARE_PROVIDER_SITE_OTHER): Payer: 59 | Admitting: Family

## 2021-09-14 ENCOUNTER — Ambulatory Visit (INDEPENDENT_AMBULATORY_CARE_PROVIDER_SITE_OTHER): Payer: 59

## 2021-09-14 VITALS — BP 122/70 | HR 76 | Temp 98.1°F | Ht 63.0 in | Wt 173.0 lb

## 2021-09-14 DIAGNOSIS — R0602 Shortness of breath: Secondary | ICD-10-CM | POA: Insufficient documentation

## 2021-09-14 MED ORDER — BUDESONIDE-FORMOTEROL FUMARATE 80-4.5 MCG/ACT IN AERO: 2.0000 | INHALATION_SPRAY | Freq: Two times a day (BID) | RESPIRATORY_TRACT | 1 refills | Status: DC

## 2021-09-14 MED ORDER — ESOMEPRAZOLE MAGNESIUM 20 MG PO CPDR: 20.0000 mg | DELAYED_RELEASE_CAPSULE | Freq: Every day | ORAL | 1 refills | Status: DC

## 2021-09-14 NOTE — Progress Notes (Signed)
? ?Subjective:  ? ? Patient ID: Kiara Rich, female    DOB: September 13, 1985, 36 y.o.   MRN: [id number] ? ?CC: Kiara Rich is a 36 y.o. female who presents today for an acute visit.   ? ?HPI: Complains of sob  x 8 weeks, unchanged.  ?She notices it when she is still and also after a meal.  ?She feels she cannot quite get enough air and taking a deep breath. ? ?She isnt sure that anxiety doesn't play a role.  ? ?Onset correlates after upper respiratory infection when she was treated with azithromycin.  ?After antibiotic , cough had improved.  ? ?She was riding bike last night with family and felt sob. She has gained weight. In the winter months, she gets less exercise.  ?  ?  ?No syncope, cp, wheezing, congestion.   ? ? ?No h/o food allergies, asthma ? ?Former smoker - quit 9 years ; second hand smoke exposure at home ? ?No seasonal allergies ?Pets at home ? ?Endorses epigastric pain. Improves when on neixum; previously on nexium however she hasnt taken recently.  ? ?Endorses early satiety.  ? ?No weight loss, vomiting, nausea, bile taste in mouth, fever, constipation.  ? ? ?No history of diabetes. ?07/26/21 treated for URI with zpak ? ?HISTORY:  ?Past Medical History:  ?Diagnosis Date  ? Anxiety   ? Frequent headaches   ? GERD (gastroesophageal reflux disease)   ? Mitral valve prolapse   ? Mild  ? ?Past Surgical History:  ?Procedure Laterality Date  ? LEEP  2012  ? Precancerous cells  ? ?Family History  ?Problem Relation Age of Onset  ? Uterine cancer Maternal Grandmother   ? Diabetes Maternal Grandmother   ? Diabetes Father   ? Hearing loss Father   ? Diabetes Mother   ? Heart attack Mother   ? Asthma Mother   ? Heart disease Mother   ? Hypertension Mother   ? Kidney disease Mother   ? Miscarriages / [place] Mother   ? Uterine cancer Paternal Aunt   ? Diabetes Paternal Uncle   ? Diabetes Paternal Grandfather   ? Diabetes Paternal Grandmother   ? Diabetes Maternal Grandfather   ? ? ?Allergies:  Penicillins ?Current Outpatient Medications on File Prior to Visit  ?Medication Sig Dispense Refill  ? ketoconazole (NIZORAL) 2 % shampoo apply three times per week, massage into scalp and leave in for 10 minutes before rinsing out 120 mL 2  ? Multiple Vitamin (MULTIVITAMIN ADULT PO) Take 1 tablet by mouth daily.    ? clobetasol (OLUX) 0.05 % topical foam Apply topically 2 (two) times daily. apply twice daily as needed to affected areas for two weeks then apply twice daily on weekends only. Avoid applying to face, groin, and axilla. Use as directed. Risk of skin atrophy with long-term use reviewed. (Patient not taking: Reported on 09/14/2021) 50 g 1  ? famotidine (PEPCID) 20 MG tablet Take 1 tablet (20 mg total) by mouth at bedtime. (Patient not taking: Reported on 09/14/2021) 30 tablet 1  ? loratadine (CLARITIN) 10 MG tablet Take 1 tablet (10 mg total) by mouth daily. (Patient not taking: Reported on 09/14/2021) 30 tablet 11  ? triamcinolone ointment (KENALOG) 0.1 % apply to affected areas twice a day for 2 weeks. Avoid applying to face, groin, and axilla. Use as directed. Risk of skin atrophy with long-term use reviewed. (Patient not taking: Reported on 09/14/2021) 80 g 0  ? ?No current facility-administered medications on  file prior to visit.  ? ? ?Social History  ? ?Tobacco Use  ? Smoking status: Former  ?  Types: Cigarettes  ?  Quit date: 06/12/2011  ?  Years since quitting: 10.2  ? Smokeless tobacco: Never  ?Vaping Use  ? Vaping Use: Never used  ?Substance Use Topics  ? Alcohol use: No  ? Drug use: No  ? ? ?Review of Systems  ?Constitutional:  Negative for chills and fever.  ?HENT:  Negative for congestion.   ?Respiratory:  Positive for shortness of breath. Negative for cough.   ?Cardiovascular:  Negative for chest pain, palpitations and leg swelling.  ?Gastrointestinal:  Negative for abdominal pain, nausea and vomiting.  ?   ?Objective:  ?  ?BP 122/70 (BP Location: Left Arm, Patient Position: Sitting, Cuff Size:  Normal)   Pulse 76   Temp 98.1 ?F (36.7 ?C) (Temporal)   Ht '5\' 3"'$  (1.6 m)   Wt 173 lb (78.5 kg)   LMP 08/31/2021 (Approximate)   SpO2 98%   BMI 30.65 kg/m?  ? ? ?Physical Exam ?Vitals reviewed.  ?Constitutional:   ?   Appearance: She is well-developed.  ?HENT:  ?   Head: Normocephalic and atraumatic.  ?   Right Ear: Hearing, tympanic membrane, ear canal and external ear normal. No decreased hearing noted. No drainage, swelling or tenderness. No middle ear effusion. No foreign body. Tympanic membrane is not erythematous or bulging.  ?   Left Ear: Hearing, tympanic membrane, ear canal and external ear normal. No decreased hearing noted. No drainage, swelling or tenderness.  No middle ear effusion. No foreign body. Tympanic membrane is not erythematous or bulging.  ?   Nose: Nose normal. No rhinorrhea.  ?   Right Sinus: No maxillary sinus tenderness or frontal sinus tenderness.  ?   Left Sinus: No maxillary sinus tenderness or frontal sinus tenderness.  ?   Mouth/Throat:  ?   Pharynx: Uvula midline. No oropharyngeal exudate or posterior oropharyngeal erythema.  ?   Tonsils: No tonsillar abscesses.  ?Eyes:  ?   Conjunctiva/sclera: Conjunctivae normal.  ?Cardiovascular:  ?   Rate and Rhythm: Regular rhythm.  ?   Pulses: Normal pulses.  ?   Heart sounds: Normal heart sounds.  ?Pulmonary:  ?   Effort: Pulmonary effort is normal.  ?   Breath sounds: Normal breath sounds. No wheezing, rhonchi or rales.  ?Lymphadenopathy:  ?   Head:  ?   Right side of head: No submental, submandibular, tonsillar, preauricular, posterior auricular or occipital adenopathy.  ?   Left side of head: No submental, submandibular, tonsillar, preauricular, posterior auricular or occipital adenopathy.  ?   Cervical: No cervical adenopathy.  ?Skin: ?   General: Skin is warm and dry.  ?Neurological:  ?   Mental Status: She is alert.  ?Psychiatric:     ?   Speech: Speech normal.     ?   Behavior: Behavior normal.     ?   Thought Content: Thought  content normal.  ? ? ?   ?Assessment & Plan:  ? ?Problem List Items Addressed This Visit   ? ?  ? Other  ? SOB (shortness of breath) - Primary  ?  Patient patient is well-appearing in the office today.  She is not labored in her speech.  Pending chest x-ray.  Discussed differentials including GERD, deconditioning, post viral symptom, anxiety.  She endorses early satiety.  She has not had weight loss, in fact weight gain.  Less  likely alternative diagnosis including superior mesenteric artery syndrome in the absence of weight loss, vomiting. ?Consider anxiety, gastroparesis at follow up.  ?Trial of Nexium, Symbicort.  Close follow-up ?  ?  ? Relevant Medications  ? esomeprazole (NEXIUM) 20 MG capsule  ? budesonide-formoterol (SYMBICORT) 80-4.5 MCG/ACT inhaler  ? Other Relevant Orders  ? DG Chest 2 View  ? IBC + Ferritin  ? B12 and Folate Panel  ? CBC with Differential/Platelet  ? Comprehensive metabolic panel  ? Iron, TIBC and Ferritin Panel  ? ? ? ? ?I am having Kiara Rich start on esomeprazole and budesonide-formoterol. I am also having her maintain her Multiple Vitamin (MULTIVITAMIN ADULT PO), triamcinolone ointment, ketoconazole, clobetasol, famotidine, and loratadine. ? ? ?Meds ordered this encounter  ?Medications  ? esomeprazole (NEXIUM) 20 MG capsule  ?  Sig: Take 1 capsule (20 mg total) by mouth daily. One hour prior to breakfast  ?  Dispense:  30 capsule  ?  Refill:  1  ?  Order Specific Question:   Supervising Provider  ?  Answer:   Crecencio Mc [[id number]]  ? budesonide-formoterol (SYMBICORT) 80-4.5 MCG/ACT inhaler  ?  Sig: Inhale 2 puffs into the lungs 2 (two) times daily.  ?  Dispense:  1 each  ?  Refill:  1  ?  Order Specific Question:   Supervising Provider  ?  Answer:   Crecencio Mc [[id number]]  ? ? ?Return precautions given.  ? ?Risks, benefits, and alternatives of the medications and treatment plan prescribed today were discussed, and patient expressed understanding.  ? ?Education regarding  symptom management and diagnosis given to patient on AVS. ? ?Continue to follow with Burnard Hawthorne, FNP for routine health maintenance.  ? ?Kiara Rich and I agreed with plan.  ? ?Kiara Paris, FNP ? ? ?

## 2021-09-14 NOTE — Assessment & Plan Note (Addendum)
Patient patient is well-appearing in the office today.  She is not labored in her speech.  Pending chest x-ray.  Discussed differentials including GERD, deconditioning, post viral symptom, anxiety.  She endorses early satiety.  She has not had weight loss, in fact weight gain.  Less likely alternative diagnosis including superior mesenteric artery syndrome in the absence of weight loss, vomiting. ?Consider anxiety, gastroparesis at follow up.  ?Trial of Nexium, Symbicort.  Close follow-up ?

## 2021-09-14 NOTE — Addendum Note (Signed)
Addended by: Neta Ehlers on: 09/14/2021 03:15 PM ? ? Modules accepted: Orders ? ?

## 2021-09-14 NOTE — Progress Notes (Signed)
Patient stated that she Kiara Rich has it after she eats and in her words " have to yawn to get out a breath" ?

## 2021-09-15 LAB — COMPREHENSIVE METABOLIC PANEL
AG Ratio: 1.6 (calc) (ref 1.0–2.5)
ALT: 12 U/L (ref 6–29)
AST: 11 U/L (ref 10–30)
Albumin: 4.6 g/dL (ref 3.6–5.1)
Alkaline phosphatase (APISO): 66 U/L (ref 31–125)
BUN: 12 mg/dL (ref 7–25)
CO2: 27 mmol/L (ref 20–32)
Calcium: 9.7 mg/dL (ref 8.6–10.2)
Chloride: 104 mmol/L (ref 98–110)
Creat: 0.78 mg/dL (ref 0.50–0.97)
Globulin: 2.8 g/dL (calc) (ref 1.9–3.7)
Glucose, Bld: 83 mg/dL (ref 65–99)
Potassium: 3.9 mmol/L (ref 3.5–5.3)
Sodium: 140 mmol/L (ref 135–146)
Total Bilirubin: 0.4 mg/dL (ref 0.2–1.2)
Total Protein: 7.4 g/dL (ref 6.1–8.1)

## 2021-09-15 LAB — CBC WITH DIFFERENTIAL/PLATELET
Absolute Monocytes: 520 cells/uL (ref 200–950)
Basophils Absolute: 24 cells/uL (ref 0–200)
Basophils Relative: 0.3 %
Eosinophils Absolute: 144 cells/uL (ref 15–500)
Eosinophils Relative: 1.8 %
HCT: 36.7 % (ref 35.0–45.0)
Hemoglobin: 12.2 g/dL (ref 11.7–15.5)
Lymphs Abs: 2064 cells/uL (ref 850–3900)
MCH: 30.1 pg (ref 27.0–33.0)
MCHC: 33.2 g/dL (ref 32.0–36.0)
MCV: 90.6 fL (ref 80.0–100.0)
MPV: 11 fL (ref 7.5–12.5)
Monocytes Relative: 6.5 %
Neutro Abs: 5248 cells/uL (ref 1500–7800)
Neutrophils Relative %: 65.6 %
Platelets: 240 10*3/uL (ref 140–400)
RBC: 4.05 10*6/uL (ref 3.80–5.10)
RDW: 12.1 % (ref 11.0–15.0)
Total Lymphocyte: 25.8 %
WBC: 8 10*3/uL (ref 3.8–10.8)

## 2021-09-15 LAB — B12 AND FOLATE PANEL
Folate: 21.3 ng/mL
Vitamin B-12: 278 pg/mL (ref 200–1100)

## 2021-09-15 LAB — IRON,TIBC AND FERRITIN PANEL
%SAT: 20 % (calc) (ref 16–45)
Ferritin: 22 ng/mL (ref 16–154)
Iron: 77 ug/dL (ref 40–190)
TIBC: 380 mcg/dL (calc) (ref 250–450)

## 2021-09-18 ENCOUNTER — Encounter: Payer: Self-pay | Admitting: Family

## 2021-09-19 ENCOUNTER — Other Ambulatory Visit: Payer: Self-pay | Admitting: Family

## 2021-09-19 DIAGNOSIS — R0602 Shortness of breath: Secondary | ICD-10-CM

## 2021-09-19 MED ORDER — FLUTICASONE-SALMETEROL 100-50 MCG/ACT IN AEPB: 1.0000 | INHALATION_SPRAY | Freq: Two times a day (BID) | RESPIRATORY_TRACT | 3 refills | Status: DC

## 2021-09-20 ENCOUNTER — Other Ambulatory Visit: Payer: Self-pay | Admitting: Family

## 2021-09-20 DIAGNOSIS — E538 Deficiency of other specified B group vitamins: Secondary | ICD-10-CM

## 2021-09-25 ENCOUNTER — Encounter: Payer: Self-pay | Admitting: Family

## 2021-09-25 ENCOUNTER — Other Ambulatory Visit: Payer: Self-pay

## 2021-09-26 ENCOUNTER — Other Ambulatory Visit: Payer: Self-pay

## 2021-09-27 NOTE — Telephone Encounter (Signed)
Pt called in wondering if NP Arnett sent over the B12 Inject to her pharmacy... Pt requesting callback...  ?

## 2021-09-29 ENCOUNTER — Other Ambulatory Visit: Payer: Self-pay | Admitting: Family

## 2021-09-29 ENCOUNTER — Other Ambulatory Visit (INDEPENDENT_AMBULATORY_CARE_PROVIDER_SITE_OTHER): Payer: 59

## 2021-09-29 DIAGNOSIS — E538 Deficiency of other specified B group vitamins: Secondary | ICD-10-CM | POA: Diagnosis not present

## 2021-09-29 LAB — B12 AND FOLATE PANEL
Folate: 17.2 ng/mL (ref >5.9)
Vitamin B-12: 175 pg/mL — ABNORMAL LOW (ref 211–911)

## 2021-09-29 MED ORDER — CYANOCOBALAMIN 1000 MCG/ML IJ SOLN: INTRAMUSCULAR | 15 refills | Status: AC

## 2021-10-03 LAB — CELIAC DISEASE AB SCREEN W/RFX
Antigliadin Abs, IgA: 6 units (ref 0–19)
IgA/Immunoglobulin A, Serum: 195 mg/dL (ref 87–352)
Transglutaminase IgA: <2 U/mL (ref 0–3)

## 2021-10-04 LAB — INTRINSIC FACTOR ANTIBODIES: Intrinsic Factor: NEGATIVE

## 2021-10-04 LAB — HOMOCYSTEINE: Homocysteine: 7.8 umol/L (ref <10.4)

## 2021-10-04 LAB — ANTI-PARIETAL ANTIBODY: PARIETAL CELL AB SCREEN: NEGATIVE

## 2021-10-04 LAB — METHYLMALONIC ACID, SERUM: Methylmalonic Acid, Quant: 173 nmol/L (ref 87–318)

## 2021-12-05 ENCOUNTER — Other Ambulatory Visit: Payer: Self-pay | Admitting: Family

## 2021-12-05 DIAGNOSIS — R0602 Shortness of breath: Secondary | ICD-10-CM

## 2022-01-23 ENCOUNTER — Encounter: Payer: Self-pay | Admitting: Family

## 2022-01-23 ENCOUNTER — Ambulatory Visit (INDEPENDENT_AMBULATORY_CARE_PROVIDER_SITE_OTHER): Payer: 59 | Admitting: Family

## 2022-01-23 VITALS — BP 118/68 | HR 67 | Temp 98.7°F | Ht 63.0 in | Wt 169.2 lb

## 2022-01-23 DIAGNOSIS — B829 Intestinal parasitism, unspecified: Secondary | ICD-10-CM | POA: Diagnosis not present

## 2022-01-23 LAB — CBC WITH DIFFERENTIAL/PLATELET
Basophils Absolute: 0 10*3/uL (ref 0.0–0.1)
Basophils Relative: 0.1 % (ref 0.0–3.0)
Eosinophils Absolute: 0.1 10*3/uL (ref 0.0–0.7)
Eosinophils Relative: 1.3 % (ref 0.0–5.0)
HCT: 37.8 % (ref 36.0–46.0)
Hemoglobin: 12.7 g/dL (ref 12.0–15.0)
Lymphocytes Relative: 13.8 % (ref 12.0–46.0)
Lymphs Abs: 1 10*3/uL (ref 0.7–4.0)
MCHC: 33.5 g/dL (ref 30.0–36.0)
MCV: 90 fl (ref 78.0–100.0)
Monocytes Absolute: 0.6 10*3/uL (ref 0.1–1.0)
Monocytes Relative: 8.3 % (ref 3.0–12.0)
Neutro Abs: 5.5 10*3/uL (ref 1.4–7.7)
Neutrophils Relative %: 76.5 % (ref 43.0–77.0)
Platelets: 207 10*3/uL (ref 150.0–400.0)
RBC: 4.2 Mil/uL (ref 3.87–5.11)
RDW: 13.3 % (ref 11.5–15.5)
WBC: 7.2 10*3/uL (ref 4.0–10.5)

## 2022-01-23 LAB — COMPREHENSIVE METABOLIC PANEL
ALT: 9 U/L (ref 0–35)
AST: 10 U/L (ref 0–37)
Albumin: 4.6 g/dL (ref 3.5–5.2)
Alkaline Phosphatase: 63 U/L (ref 39–117)
BUN: 10 mg/dL (ref 6–23)
CO2: 27 mEq/L (ref 19–32)
Calcium: 9.2 mg/dL (ref 8.4–10.5)
Chloride: 103 mEq/L (ref 96–112)
Creatinine, Ser: 0.71 mg/dL (ref 0.40–1.20)
GFR: 108.65 mL/min (ref >60.00)
Glucose, Bld: 105 mg/dL — ABNORMAL HIGH (ref 70–99)
Potassium: 4.1 mEq/L (ref 3.5–5.1)
Sodium: 137 mEq/L (ref 135–145)
Total Bilirubin: 0.5 mg/dL (ref 0.2–1.2)
Total Protein: 7.4 g/dL (ref 6.0–8.3)

## 2022-01-23 LAB — TSH: TSH: 1.16 u[IU]/mL (ref 0.35–5.50)

## 2022-01-23 LAB — C-REACTIVE PROTEIN: CRP: <1 mg/dL (ref 0.5–20.0)

## 2022-01-23 LAB — B12 AND FOLATE PANEL
Folate: >24.2 ng/mL (ref >5.9)
Vitamin B-12: 341 pg/mL (ref 211–911)

## 2022-01-23 NOTE — Assessment & Plan Note (Signed)
Patient well-appearing today, benign abdominal exam and fortunately no neurologic deficits at this time.  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  We agreed we would for further evaluation currently pending stool ova and parasite micrology, labs today.  I will also plan to consult with Dr. Vicente Males

## 2022-01-23 NOTE — Progress Notes (Signed)
Subjective:    Patient ID: Kiara Rich, female    DOB: May 06, 1986, 36 y.o.   MRN: [id number]  CC: Kiara Rich is a 36 y.o. female who presents today for an acute visit.    HPI: She complains of chronic bloating, indigestion for years which is unchanged.   She is here today for concern for parasite infection.  Her family Animal nutritionist tested her stool under microscope which is positive for tapeworm dipylidium egg and larva.   She has fluctuating constipation and diarrhea.  Peanuts cause diarrhea.  No particular other exacerbating or relieving foods.  No articifial sugars. One cup coffee per day.   She reports very rare HA.   No weight loss, blood in stool, changes in stool sample, vision change, fever, nausea ,vomiting.       LMP 28 days ago. She anticipates mense today/tomorrow.  Menses are regular.   HISTORY:  Past Medical History:  Diagnosis Date   Anxiety    Frequent headaches    GERD (gastroesophageal reflux disease)    Mitral valve prolapse    Mild   Past Surgical History:  Procedure Laterality Date   LEEP  2012   Precancerous cells   Family History  Problem Relation Age of Onset   Uterine cancer Maternal Grandmother    Diabetes Maternal Grandmother    Diabetes Father    Hearing loss Father    Diabetes Mother    Heart attack Mother    Asthma Mother    Heart disease Mother    Hypertension Mother    Kidney disease Mother    Miscarriages / [place] Mother    Uterine cancer Paternal Aunt    Diabetes Paternal Uncle    Diabetes Paternal Grandfather    Diabetes Paternal Grandmother    Diabetes Maternal Grandfather     Allergies: Penicillins Current Outpatient Medications on File Prior to Visit  Medication Sig Dispense Refill   cyanocobalamin (,VITAMIN B-12,) 1000 MCG/ML injection 1000 mcg (1 mL) intramuscular injection in the thigh ( vastus lateralis) once per week for four weeks, followed by 1000 mcg IM injection once per month. 1 mL 15    esomeprazole (NEXIUM) 20 MG capsule TAKE ONE CAPSULE BY MOUTH DAILY ONE HOUR BEFORE BREAKFAST 30 capsule 1   Multiple Vitamin (MULTIVITAMIN ADULT PO) Take 1 tablet by mouth daily.     triamcinolone ointment (KENALOG) 0.1 % apply to affected areas twice a day for 2 weeks. Avoid applying to face, groin, and axilla. Use as directed. Risk of skin atrophy with long-term use reviewed. 80 g 0   No current facility-administered medications on file prior to visit.    Social History   Tobacco Use   Smoking status: Former    Types: Cigarettes    Quit date: 06/12/2011    Years since quitting: 10.6   Smokeless tobacco: Never  Vaping Use   Vaping Use: Never used  Substance Use Topics   Alcohol use: No   Drug use: No    Review of Systems  Constitutional:  Negative for chills, fever and unexpected weight change.  Respiratory:  Negative for cough.   Cardiovascular:  Negative for chest pain and palpitations.  Gastrointestinal:  Positive for abdominal distention, constipation and diarrhea. Negative for abdominal pain, blood in stool, nausea and vomiting.      Objective:    BP 118/68   Pulse 67   Temp 98.7 F (37.1 C) (Oral)   Ht '5\' 3"'$  (1.6 m)   Wt 169  lb 3.2 oz (76.7 kg)   SpO2 98%   BMI 29.97 kg/m   Wt Readings from Last 3 Encounters:  01/23/22 169 lb 3.2 oz (76.7 kg)  09/14/21 173 lb (78.5 kg)  12/30/20 163 lb 3.2 oz (74 kg)    Physical Exam Vitals reviewed.  Constitutional:      Appearance: Normal appearance. She is well-developed.  Eyes:     Conjunctiva/sclera: Conjunctivae normal.  Cardiovascular:     Rate and Rhythm: Normal rate and regular rhythm.     Pulses: Normal pulses.     Heart sounds: Normal heart sounds.  Pulmonary:     Effort: Pulmonary effort is normal.     Breath sounds: Normal breath sounds. No wheezing, rhonchi or rales.  Abdominal:     General: Bowel sounds are normal. There is no distension.     Palpations: Abdomen is soft. Abdomen is not rigid. There is  no fluid wave or mass.     Tenderness: There is no abdominal tenderness. There is no guarding or rebound.  Skin:    General: Skin is warm and dry.  Neurological:     Mental Status: She is alert.  Psychiatric:        Speech: Speech normal.        Behavior: Behavior normal.        Thought Content: Thought content normal.        Assessment & Plan:   Problem List Items Addressed This Visit       Other   Parasites in stool - Primary    Patient well-appearing today, benign abdominal exam and fortunately no neurologic deficits at this time.  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  We agreed we would for further evaluation currently pending stool ova and parasite micrology, labs today.  I will also plan to consult with Dr. Vicente Males      Relevant Orders   Comprehensive metabolic panel (Completed)   CBC with Differential/Platelet (Completed)   B12 and Folate Panel (Completed)   C-reactive protein (Completed)   TSH (Completed)   Ambulatory referral to Gastroenterology   Ova and parasite examination   Calprotectin, Fecal      I have discontinued Cristopher Peru. Enrique's ketoconazole, clobetasol, famotidine, loratadine, and fluticasone-salmeterol. I am also having her maintain her Multiple Vitamin (MULTIVITAMIN ADULT PO), triamcinolone ointment, cyanocobalamin, and esomeprazole.   No orders of the defined types were placed in this encounter.   Return precautions given.   Risks, benefits, and alternatives of the medications and treatment plan prescribed today were discussed, and patient expressed understanding.   Education regarding symptom management and diagnosis given to patient on AVS.  Continue to follow with Burnard Hawthorne, FNP for routine health maintenance.   Gennette Pac and I agreed with plan.   Mable Paris, FNP

## 2022-01-23 NOTE — Patient Instructions (Signed)
Please return stool studies  Referral to gastroenterology  Let us know if you dont hear back within a week in regards to an appointment being scheduled.

## 2022-01-24 ENCOUNTER — Other Ambulatory Visit
Admission: RE | Admit: 2022-01-24 | Discharge: 2022-01-24 | Disposition: A | Discharge status: Home / Self Care | Payer: 59 | Source: Ambulatory Visit | Attending: Family | Admitting: Family

## 2022-01-24 DIAGNOSIS — B829 Intestinal parasitism, unspecified: Secondary | ICD-10-CM | POA: Diagnosis present

## 2022-01-31 ENCOUNTER — Telehealth: Payer: Self-pay | Admitting: Family

## 2022-01-31 NOTE — Telephone Encounter (Signed)
Test was sent out to [place], awaitng a return call with an update on this test.

## 2022-01-31 NOTE — Telephone Encounter (Signed)
Jenate, call pt  We are calling lab as ridiculous that it has taken this long to result ova & parasites  Toya, jill, can you check on O & P stool study?

## 2022-02-01 ENCOUNTER — Encounter: Payer: Self-pay | Admitting: Family

## 2022-02-01 LAB — CALPROTECTIN, FECAL: Calprotectin, Fecal: 32 ug/g (ref 0–120)

## 2022-02-02 LAB — GIARDIA, EIA; OVA/PARASITE: Giardia Ag, Stl: NEGATIVE

## 2022-02-02 LAB — O&P RESULT

## 2022-02-05 ENCOUNTER — Telehealth: Payer: Self-pay | Admitting: Family

## 2022-02-05 DIAGNOSIS — B829 Intestinal parasitism, unspecified: Secondary | ICD-10-CM

## 2022-02-05 NOTE — Telephone Encounter (Signed)
-----   Message from Jonathon Bellows, MD sent at 02/05/2022  1:40 PM EDT ----- 1. Retest x 3 in total 2. Not sure why she went to a Vet and what they have tested? 3. IF not seen any worms or had a positive stool test wont rush to treat  4. Test ordered by you is correct  5. I will see her at the office

## 2022-02-05 NOTE — Progress Notes (Signed)
1. Retest x 3 in total 2. Not sure why she went to a Vet and what they have tested? 3. IF not seen any worms or had a positive stool test wont rush to treat  4. Test ordered by you is correct  5. I will see her at the office

## 2022-02-05 NOTE — Telephone Encounter (Signed)
Call pt  Consulted with Dr. Vicente Males and he agreed it was the correct ova & parasite test that I ordered.  He advised to retest x3 test in total. If she has not seen any worms or had a positive stool test, he advised not to rush to treat.  I have ordered another stool test.  Please have her leave this [facility] mall.    After the second test she will need to do 1 more which I have ordered as well to complete 3 tests.

## 2022-02-06 NOTE — Telephone Encounter (Signed)
LVM to call back to office  

## 2022-02-08 NOTE — Telephone Encounter (Signed)
LVM to call back to office  

## 2022-02-19 NOTE — Telephone Encounter (Signed)
Letter mailed on 02/19/22

## 2022-02-27 ENCOUNTER — Encounter: Payer: Self-pay | Admitting: Family

## 2022-03-08 ENCOUNTER — Encounter: Payer: Self-pay | Admitting: Family

## 2022-03-08 ENCOUNTER — Other Ambulatory Visit
Admission: RE | Admit: 2022-03-08 | Discharge: 2022-03-08 | Disposition: A | Discharge status: Home / Self Care | Payer: 59 | Source: Ambulatory Visit | Attending: Family | Admitting: Family

## 2022-03-08 DIAGNOSIS — B829 Intestinal parasitism, unspecified: Secondary | ICD-10-CM | POA: Diagnosis present

## 2022-03-09 NOTE — Telephone Encounter (Signed)
Spoke to patient and advised her to got to UC today and not wait until next week to be seen. Also to not go through the weekend in pain either  and pt stated that she would go to UC and then make appt to see [place] next week for f/up

## 2022-03-12 ENCOUNTER — Encounter: Payer: Self-pay | Admitting: Gastroenterology

## 2022-03-12 ENCOUNTER — Other Ambulatory Visit: Payer: Self-pay

## 2022-03-12 ENCOUNTER — Ambulatory Visit (INDEPENDENT_AMBULATORY_CARE_PROVIDER_SITE_OTHER): Payer: 59 | Admitting: Gastroenterology

## 2022-03-12 VITALS — BP 122/80 | HR 76 | Temp 98.5°F | Ht 63.0 in | Wt 172.0 lb

## 2022-03-12 DIAGNOSIS — R1084 Generalized abdominal pain: Secondary | ICD-10-CM

## 2022-03-12 DIAGNOSIS — K921 Melena: Secondary | ICD-10-CM | POA: Diagnosis not present

## 2022-03-12 MED ORDER — NA SULFATE-K SULFATE-MG SULF 17.5-3.13-1.6 GM/177ML PO SOLN: 354.0000 mL | Freq: Once | ORAL | 0 refills | Status: AC

## 2022-03-12 NOTE — Progress Notes (Signed)
Jonathon Bellows MD, MRCP(U.K) [address]  [place]  [place], [place] [address]  Main: [phone]  Fax: [phone]   Gastroenterology Consultation  Referring Provider:     Burnard Hawthorne, FNP Primary Care Physician:  Burnard Hawthorne, FNP Primary Gastroenterologist:  Dr. Jonathon Bellows  Reason for Consultation:   Parasites in stool        HPI:   Kiara Rich is a 36 y.o. y/o female referred for consultation & management  by  Burnard Hawthorne, FNP.    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.  She has been having GI issues for a few months if not longer complains of generalized abdominal pain on and off no clear aggravating factors except for constipation at times relieving factors sometimes a bowel movement.  He denies any NSAID use.  Nonradiating.  Sometimes left-sided abdomen sometimes in the suprapubic area.  Sometimes associated with heartburn.  She takes her pet to the vet who apparently tested stool and said she had a tapeworm as well as some eggs in her stool.  Go up to a few days without a bowel movement.  When she does have a bowel movement after a few days at times not severe perianal pain.  She also noticed blood on her stool in the past. 09/14/2021: Hemoglobin 12.2 g, B12 278, iron studies normal, celiac serology negative B12 checked on 09/29/2021 low  01/24/2022 fecal calprotectin normal stool for ova parasite Giardia negative 03/08/2022 stool for ova parasite negative    Past Medical History:  Diagnosis Date   Anxiety    Frequent headaches    GERD (gastroesophageal reflux disease)    Mitral valve prolapse    Mild    Past Surgical History:  Procedure Laterality Date   LEEP  2012   Precancerous cells    Prior to Admission medications   Medication Sig Start Date End Date Taking? Authorizing Provider  cyanocobalamin (,VITAMIN B-12,) 1000 MCG/ML injection 1000 mcg (1 mL) intramuscular injection in the thigh ( vastus lateralis) once per week for four weeks,  followed by 1000 mcg IM injection once per month. 09/29/21   Burnard Hawthorne, FNP  esomeprazole (NEXIUM) 20 MG capsule TAKE ONE CAPSULE BY MOUTH DAILY ONE HOUR BEFORE BREAKFAST 12/05/21   Burnard Hawthorne, FNP  Multiple Vitamin (MULTIVITAMIN ADULT PO) Take 1 tablet by mouth daily.    [provider]  triamcinolone ointment (KENALOG) 0.1 % apply to affected areas twice a day for 2 weeks. Avoid applying to face, groin, and axilla. Use as directed. Risk of skin atrophy with long-term use reviewed. 10/18/20   Alfonso Patten, MD    Family History  Problem Relation Age of Onset   Uterine cancer Maternal Grandmother    Diabetes Maternal Grandmother    Diabetes Father    Hearing loss Father    Diabetes Mother    Heart attack Mother    Asthma Mother    Heart disease Mother    Hypertension Mother    Kidney disease Mother    Miscarriages / [place] Mother    Uterine cancer Paternal Aunt    Diabetes Paternal Uncle    Diabetes Paternal Grandfather    Diabetes Paternal Grandmother    Diabetes Maternal Grandfather      Social History   Tobacco Use   Smoking status: Former    Types: Cigarettes    Quit date: 06/12/2011    Years since quitting: 10.7   Smokeless tobacco: Never  Vaping Use   Vaping Use: Never used  Substance Use Topics   Alcohol use: No   Drug use: No    Allergies as of 03/12/2022 - Review Complete 03/12/2022  Allergen Reaction Noted   Penicillins Itching 12/04/2013    Review of Systems:    All systems reviewed and negative except where noted in HPI.   Physical Exam:  BP 122/80   Pulse 76   Temp 98.5 F (36.9 C) (Oral)   Ht '5\' 3"'$  (1.6 m)   Wt 172 lb (78 kg)   BMI 30.47 kg/m  No LMP recorded. Psych:  Alert and cooperative. Normal mood and affect. General:   Alert,  Well-developed, well-nourished, pleasant and cooperative in NAD Head:  Normocephalic and atraumatic. Eyes:  Sclera clear, no icterus.   Conjunctiva pink. Ears:  Normal auditory  acuity.\ Neurologic:  Alert and oriented x3;  grossly normal neurologically. Psych:  Alert and cooperative. Normal mood and affect.  Imaging Studies: No results found.  Assessment and Plan:   Kiara Rich is a 36 y.o. y/o female has been referred for rectal stool.  Apparently was checked by her back and was told she had a worm or parasite.  Stool test at our hospital has not shown any evidence of eggs or parasites.  She is not anemic.  She does have a history which is suggestive of possible IBS constipation but she was also seen blood in the stool and she would recommend further evaluation.  Plan 1.  EGD and colonoscopy 2.  High-fiber diet 3.  As needed use of MiraLAX daily if not having adequate bowel movements despite high-fiber diet. 4.  Suspect bleeding could be hemorrhoidal but will rule out polyps or other conditions such as Crohn's disease. 5.  Since stool tests have been negative checked at least on 2 occasions will not empirically treat as we do not know what we are treating  I have discussed alternative options, risks & benefits,  which include, but are not limited to, bleeding, infection, perforation,respiratory complication & drug reaction.  The patient agrees with this plan & written consent will be obtained.     Follow up in 3 months  Dr Jonathon Bellows MD,MRCP(U.K)

## 2022-03-12 NOTE — Patient Instructions (Signed)

## 2022-03-16 LAB — GIARDIA, EIA; OVA/PARASITE: Giardia Ag, Stl: NEGATIVE

## 2022-03-16 LAB — O&P RESULT

## 2022-03-16 NOTE — Progress Notes (Signed)
Thanks

## 2022-04-17 ENCOUNTER — Telehealth: Payer: Self-pay

## 2022-04-17 NOTE — Telephone Encounter (Signed)
Patient called stating that she is to cancel her procedure and didn't know when she would reschedule it. Patient stated thst she would call us back to let us know when to reschedule it.

## 2022-04-18 ENCOUNTER — Ambulatory Visit: Admission: RE | Admit: 2022-04-18 | Payer: 59 | Source: Home / Self Care | Admitting: Gastroenterology

## 2022-04-18 ENCOUNTER — Encounter: Admission: RE | Payer: Self-pay | Source: Home / Self Care

## 2022-04-18 SURGERY — COLONOSCOPY WITH PROPOFOL
Anesthesia: General

## 2022-06-12 ENCOUNTER — Ambulatory Visit: Payer: 59 | Admitting: Gastroenterology

## 2023-02-13 ENCOUNTER — Encounter: Payer: Self-pay | Admitting: Family

## 2023-03-18 DIAGNOSIS — Z6828 Body mass index (BMI) 28.0-28.9, adult: Secondary | ICD-10-CM | POA: Diagnosis not present

## 2023-03-18 DIAGNOSIS — Z01419 Encounter for gynecological examination (general) (routine) without abnormal findings: Secondary | ICD-10-CM | POA: Diagnosis not present

## 2023-05-15 ENCOUNTER — Ambulatory Visit (INDEPENDENT_AMBULATORY_CARE_PROVIDER_SITE_OTHER): Payer: BC Managed Care – PPO | Admitting: Family

## 2023-05-15 ENCOUNTER — Encounter: Payer: Self-pay | Admitting: Family

## 2023-05-15 VITALS — BP 118/78 | HR 62 | Temp 98.1°F | Ht 63.0 in | Wt 168.0 lb

## 2023-05-15 DIAGNOSIS — Z13220 Encounter for screening for lipoid disorders: Secondary | ICD-10-CM | POA: Diagnosis not present

## 2023-05-15 DIAGNOSIS — K5900 Constipation, unspecified: Secondary | ICD-10-CM | POA: Diagnosis not present

## 2023-05-15 DIAGNOSIS — R198 Other specified symptoms and signs involving the digestive system and abdomen: Secondary | ICD-10-CM | POA: Diagnosis not present

## 2023-05-15 DIAGNOSIS — Z8639 Personal history of other endocrine, nutritional and metabolic disease: Secondary | ICD-10-CM | POA: Diagnosis not present

## 2023-05-15 DIAGNOSIS — E538 Deficiency of other specified B group vitamins: Secondary | ICD-10-CM | POA: Diagnosis not present

## 2023-05-15 DIAGNOSIS — R197 Diarrhea, unspecified: Secondary | ICD-10-CM

## 2023-05-15 DIAGNOSIS — Z136 Encounter for screening for cardiovascular disorders: Secondary | ICD-10-CM | POA: Diagnosis not present

## 2023-05-15 LAB — CBC WITH DIFFERENTIAL/PLATELET
Basophils Absolute: 0 10*3/uL (ref 0.0–0.1)
Basophils Relative: 0.3 % (ref 0.0–3.0)
Eosinophils Absolute: 1.6 10*3/uL — ABNORMAL HIGH (ref 0.0–0.7)
Eosinophils Relative: 15.7 % — ABNORMAL HIGH (ref 0.0–5.0)
HCT: 40.8 % (ref 36.0–46.0)
Hemoglobin: 13.6 g/dL (ref 12.0–15.0)
Lymphocytes Relative: 20.7 % (ref 12.0–46.0)
Lymphs Abs: 2.1 10*3/uL (ref 0.7–4.0)
MCHC: 33.3 g/dL (ref 30.0–36.0)
MCV: 92.3 fL (ref 78.0–100.0)
Monocytes Absolute: 0.5 10*3/uL (ref 0.1–1.0)
Monocytes Relative: 5.4 % (ref 3.0–12.0)
Neutro Abs: 5.8 10*3/uL (ref 1.4–7.7)
Neutrophils Relative %: 57.9 % (ref 43.0–77.0)
Platelets: 232 10*3/uL (ref 150.0–400.0)
RBC: 4.42 Mil/uL (ref 3.87–5.11)
RDW: 13.5 % (ref 11.5–15.5)
WBC: 9.9 10*3/uL (ref 4.0–10.5)

## 2023-05-15 LAB — COMPREHENSIVE METABOLIC PANEL
ALT: 11 U/L (ref 0–35)
AST: 11 U/L (ref 0–37)
Albumin: 4.6 g/dL (ref 3.5–5.2)
Alkaline Phosphatase: 75 U/L (ref 39–117)
BUN: 10 mg/dL (ref 6–23)
CO2: 30 meq/L (ref 19–32)
Calcium: 9.4 mg/dL (ref 8.4–10.5)
Chloride: 103 meq/L (ref 96–112)
Creatinine, Ser: 0.69 mg/dL (ref 0.40–1.20)
GFR: 109.89 mL/min (ref >60.00)
Glucose, Bld: 94 mg/dL (ref 70–99)
Potassium: 4.3 meq/L (ref 3.5–5.1)
Sodium: 138 meq/L (ref 135–145)
Total Bilirubin: 0.5 mg/dL (ref 0.2–1.2)
Total Protein: 7.5 g/dL (ref 6.0–8.3)

## 2023-05-15 LAB — B12 AND FOLATE PANEL
Folate: 21.3 ng/mL (ref >5.9)
Vitamin B-12: 205 pg/mL — ABNORMAL LOW (ref 211–911)

## 2023-05-15 LAB — LIPID PANEL
Cholesterol: 149 mg/dL (ref 0–200)
HDL: 39.9 mg/dL (ref >39.00)
LDL Cholesterol: 95 mg/dL (ref 0–99)
NonHDL: 109.3
Total CHOL/HDL Ratio: 4
Triglycerides: 73 mg/dL (ref 0.0–149.0)
VLDL: 14.6 mg/dL (ref 0.0–40.0)

## 2023-05-15 LAB — TSH: TSH: 1.19 u[IU]/mL (ref 0.35–5.50)

## 2023-05-15 LAB — HEMOGLOBIN A1C: Hgb A1c MFr Bld: 5.5 % (ref 4.6–6.5)

## 2023-05-15 LAB — VITAMIN D 25 HYDROXY (VIT D DEFICIENCY, FRACTURES): VITD: 30.1 ng/mL (ref 30.00–100.00)

## 2023-05-15 NOTE — Assessment & Plan Note (Addendum)
Benign abdominal exam today.  Patient nontoxic in appearance.  No unusual weight loss.  Rectal bleeding has resolved.  Discussed constipation, GERD, cholelithiasis.  Start nexium OTC, Metamucil, MiraLAX if needed.  Advised to FODMAP diet.  If symptoms not resolved, advised to follow-up with Dr. Tobi Bastos to pursue EGD/colonoscopy.  Pending abdominal ultrasound.   consider SIBO.

## 2023-05-15 NOTE — Patient Instructions (Addendum)
I question if underlying constipation, IBS, and/or GERD   Start nexium , take 30 minutes to hour before coffee or breakfast   To bulk stool, you may either try Metamucil 3.4 g daily up to 3 times daily .  Or you may try Benefiber 1 tablespoon 3 times daily with meals.  Benefiber and Metamucil work in the same way. They absorb water from your intestines to form softer, bulkier stools. These stools flow more easily through your digestive system, which helps you have easier bowel movements. These supplements also increase how often you have bowel movements.  It is important to increase dietary fiber and water intake as well. .  If Metamucil is not helpful, I would like for you to start MiraLAX 2 or 3 days/week  I have ordered an ultrasound. Let us know if you dont hear back within a week in regards to an appointment being scheduled.   So that you are aware, if you are [facility] MyChart user , please pay attention to your MyChart messages as you may receive a MyChart message with a phone number to call and schedule this test/appointment own your own from our referral coordinator. This is a new process so I do not want you to miss this message.  If you are not a MyChart user, you will receive a phone call.       Low-FODMAP Eating Plan  FODMAP stands for fermentable oligosaccharides, disaccharides, monosaccharides, and polyols. These are sugars that are hard for some people to digest. A low-FODMAP eating plan may help some people who have irritable bowel syndrome (IBS) and certain other bowel (intestinal) diseases to manage their symptoms. This meal plan can be complicated to follow. Work with a diet and nutrition specialist (dietitian) to make a low-FODMAP eating plan that is right for you. A dietitian can help make sure that you get enough nutrition from this diet. What are tips for following this plan? Reading food labels Check labels for hidden FODMAPs such as: High-fructose  syrup. Honey. Agave. Natural fruit flavors. Onion or garlic powder. Choose low-FODMAP foods that contain 3-4 grams of fiber per serving. Check food labels for serving sizes. Eat only one serving at a time to make sure FODMAP levels stay low. Shopping Shop with a list of foods that are recommended on this diet and make a meal plan. Meal planning Follow a low-FODMAP eating plan for up to 6 weeks, or as told by your health care provider or dietitian. To follow the eating plan: Eliminate high-FODMAP foods from your diet completely. Choose only low-FODMAP foods to eat. You will do this for 2-6 weeks. Gradually reintroduce high-FODMAP foods into your diet one at a time. Most people should wait a few days before introducing the next new high-FODMAP food into their meal plan. Your dietitian can recommend how quickly you may reintroduce foods. Keep a daily record of what and how much you eat and drink. Make note of any symptoms that you have after eating. Review your daily record with a dietitian regularly to identify which foods you can eat and which foods you should avoid. General tips Drink enough fluid each day to keep your urine pale yellow. Avoid processed foods. These often have added sugar and may be high in FODMAPs. Avoid most dairy products, whole grains, and sweeteners. Work with a dietitian to make sure you get enough fiber in your diet. Avoid high FODMAP foods at meals to manage symptoms. Recommended foods Fruits Bananas, oranges, tangerines, lemons, limes, blueberries,  raspberries, strawberries, grapes, cantaloupe, honeydew melon, kiwi, papaya, passion fruit, and pineapple. Limited amounts of dried cranberries, banana chips, and shredded coconut. Vegetables Eggplant, zucchini, cucumber, peppers, green beans, bean sprouts, lettuce, arugula, kale, Swiss chard, spinach, collard greens, bok choy, summer squash, potato, and tomato. Limited amounts of corn, carrot, and sweet potato. Green  parts of scallions. Grains Gluten-free grains, such as rice, oats, buckwheat, quinoa, corn, polenta, and millet. Gluten-free pasta, bread, or cereal. Rice noodles. Corn tortillas. Meats and other proteins Unseasoned beef, pork, poultry, or fish. Eggs. Tomasa Blase. Tofu (firm) and tempeh. Limited amounts of nuts and seeds, such as almonds, walnuts, [place] nuts, pecans, peanuts, nut butters, pumpkin seeds, chia seeds, and sunflower seeds. Dairy Lactose-free milk, yogurt, and kefir. Lactose-free cottage cheese and ice cream. Non-dairy milks, such as almond, coconut, hemp, and rice milk. Non-dairy yogurt. Limited amounts of goat cheese, brie, mozzarella, parmesan, swiss, and other hard cheeses. Fats and oils Butter-free spreads. Vegetable oils, such as olive, canola, and sunflower oil. Seasoning and other foods Artificial sweeteners with names that do not end in "ol," such as aspartame, saccharine, and stevia. Maple syrup, white table sugar, raw sugar, brown sugar, and molasses. Mayonnaise, soy sauce, and tamari. Fresh basil, coriander, parsley, rosemary, and thyme. Beverages Water and mineral water. Sugar-sweetened soft drinks. Small amounts of orange juice or cranberry juice. Black and green tea. Most dry wines. Coffee. The items listed above may not be a complete list of foods and beverages you can eat. Contact a dietitian for more information. Foods to avoid Fruits Fresh, dried, and juiced forms of apple, pear, watermelon, peach, plum, cherries, apricots, blackberries, boysenberries, figs, nectarines, and mango. Avocado. Vegetables Chicory root, artichoke, asparagus, cabbage, snow peas, Brussels sprouts, broccoli, sugar snap peas, mushrooms, celery, and cauliflower. Onions, garlic, leeks, and the white part of scallions. Grains Wheat, including kamut, durum, and semolina. Barley and bulgur. Couscous. Wheat-based cereals. Wheat noodles, bread, crackers, and pastries. Meats and other proteins Fried or  fatty meat. Sausage. Cashews and pistachios. Soybeans, baked beans, black beans, chickpeas, kidney beans, fava beans, navy beans, lentils, black-eyed peas, and split peas. Dairy Milk, yogurt, ice cream, and soft cheese. Cream and sour cream. Milk-based sauces. Custard. Buttermilk. Soy milk. Seasoning and other foods Any sugar-free gum or candy. Foods that contain artificial sweeteners such as sorbitol, mannitol, isomalt, or xylitol. Foods that contain honey, high-fructose corn syrup, or agave. Bouillon, vegetable stock, beef stock, and chicken stock. Garlic and onion powder. Condiments made with onion, such as hummus, chutney, pickles, relish, salad dressing, and salsa. Tomato paste. Beverages Chicory-based drinks. Coffee substitutes. Chamomile tea. Fennel tea. Sweet or fortified wines such as port or sherry. Diet soft drinks made with isomalt, mannitol, maltitol, sorbitol, or xylitol. Apple, pear, and mango juice. Juices with high-fructose corn syrup. The items listed above may not be a complete list of foods and beverages you should avoid. Contact a dietitian for more information. Summary FODMAP stands for fermentable oligosaccharides, disaccharides, monosaccharides, and polyols. These are sugars that are hard for some people to digest. A low-FODMAP eating plan is a short-term diet that helps to ease symptoms of certain bowel diseases. The eating plan usually lasts up to 6 weeks. After that, high-FODMAP foods are reintroduced gradually and one at a time. This can help you find out which foods may be causing symptoms. A low-FODMAP eating plan can be complicated. It is best to work with a dietitian who has experience with this type of plan. This information is not intended to  replace advice given to you by your health care provider. Make sure you discuss any questions you have with your health care provider. Document Revised: 10/15/2019 Document Reviewed: 10/15/2019 Elsevier Patient Education  2024  [employer].

## 2023-05-15 NOTE — Progress Notes (Signed)
Assessment & Plan:  Alternating constipation and diarrhea Assessment & Plan: Benign abdominal exam today.  Patient nontoxic in appearance.  No unusual weight loss.  Rectal bleeding has resolved.  Discussed constipation, GERD, cholelithiasis.  Start nexium OTC, Metamucil, MiraLAX if needed.  Advised to FODMAP diet.  If symptoms not resolved, advised to follow-up with Dr. Tobi Bastos to pursue EGD/colonoscopy.  Pending abdominal ultrasound.   consider SIBO.   Orders: -     VITAMIN D 25 Hydroxy (Vit-D Deficiency, Fractures) -     Hemoglobin A1c -     CBC with Differential/Platelet -     Comprehensive metabolic panel -     TSH -     US Abdomen Complete; Future  B12 deficiency -     B12 and Folate Panel  Encounter for lipid screening for cardiovascular disease -     Lipid panel  History of vitamin D deficiency -     VITAMIN D 25 Hydroxy (Vit-D Deficiency, Fractures)     Return precautions given.   Risks, benefits, and alternatives of the medications and treatment plan prescribed today were discussed, and patient expressed understanding.   Education regarding symptom management and diagnosis given to patient on AVS either electronically or printed.  Return in about 3 months (around 08/13/2023).  Rennie Plowman, FNP  Subjective:    Patient ID: Kiara Rich, female    DOB: [birth year], 37 y.o.   MRN: [id number]  CC: Kiara Rich is a 37 y.o. female who presents today for an acute visit.    HPI: Complains of 'stomach isssue' off and on for 10 years, episodic.   Over the month , symptom occurred after she eats.   She has epigastric pain after eating and feels like 'gas pain.'  Stool can fluctuate between formed to watery brown. No foam or blood in stool.   Endorses constipation, occasional nausea. Episode of burping.   No vomiting, fever, pelvic pain.   Appeptiee is good. Weight is stable.     No further episodes of rectal bleeding.  Drinks coffee in the morning with  artificial sugar   Ova and parasite negative x 2 2023 Fecal calprotectin 01/24/22 Colonoscopy /EGD canceled 04/2022 due lack of coverage   Very rare NSAIDs.  No alcohol use.  Consult Dr. Tobi Bastos 03/12/2022 Allergies: Penicillins Current Outpatient Medications on File Prior to Visit  Medication Sig Dispense Refill   cyanocobalamin (,VITAMIN B-12,) 1000 MCG/ML injection 1000 mcg (1 mL) intramuscular injection in the thigh ( vastus lateralis) once per week for four weeks, followed by 1000 mcg IM injection once per month. 1 mL 15   Multiple Vitamin (MULTIVITAMIN ADULT PO) Take 1 tablet by mouth daily.     triamcinolone ointment (KENALOG) 0.1 % apply to affected areas twice a day for 2 weeks. Avoid applying to face, groin, and axilla. Use as directed. Risk of skin atrophy with long-term use reviewed. 80 g 0   No current facility-administered medications on file prior to visit.    Review of Systems  Constitutional:  Negative for chills and fever.  HENT:  Negative for congestion.   Respiratory:  Negative for cough.   Cardiovascular:  Negative for chest pain and palpitations.  Gastrointestinal:  Positive for abdominal distention, constipation, diarrhea and nausea. Negative for anal bleeding, blood in stool and vomiting.      Objective:    BP 118/78   Pulse 62   Temp 98.1 F (36.7 C) (Oral)   Ht 5\' 3"  (1.6 m)  Wt 168 lb (76.2 kg)   LMP  (LMP Unknown)   SpO2 98%   BMI 29.76 kg/m   BP Readings from Last 3 Encounters:  05/15/23 118/78  03/12/22 122/80  01/23/22 118/68   Wt Readings from Last 3 Encounters:  05/15/23 168 lb (76.2 kg)  03/12/22 172 lb (78 kg)  01/23/22 169 lb 3.2 oz (76.7 kg)    Physical Exam Vitals reviewed.  Constitutional:      Appearance: Normal appearance. She is well-developed.  Eyes:     Conjunctiva/sclera: Conjunctivae normal.  Cardiovascular:     Rate and Rhythm: Normal rate and regular rhythm.     Pulses: Normal pulses.     Heart sounds: Normal  heart sounds.  Pulmonary:     Effort: Pulmonary effort is normal.     Breath sounds: Normal breath sounds. No wheezing, rhonchi or rales.  Abdominal:     General: Bowel sounds are normal. There is no distension.     Palpations: Abdomen is soft. Abdomen is not rigid. There is no fluid wave or mass.     Tenderness: There is no abdominal tenderness. There is no guarding or rebound.  Skin:    General: Skin is warm and dry.  Neurological:     Mental Status: She is alert.  Psychiatric:        Speech: Speech normal.        Behavior: Behavior normal.        Thought Content: Thought content normal.

## 2023-05-21 ENCOUNTER — Other Ambulatory Visit: Payer: Self-pay

## 2023-05-21 ENCOUNTER — Other Ambulatory Visit (INDEPENDENT_AMBULATORY_CARE_PROVIDER_SITE_OTHER): Payer: BC Managed Care – PPO

## 2023-05-21 ENCOUNTER — Encounter: Payer: Self-pay | Admitting: Family

## 2023-05-21 ENCOUNTER — Ambulatory Visit
Admission: RE | Admit: 2023-05-21 | Discharge: 2023-05-21 | Disposition: A | Discharge status: Home / Self Care | Payer: BC Managed Care – PPO | Source: Ambulatory Visit | Attending: Family | Admitting: Family

## 2023-05-21 DIAGNOSIS — E538 Deficiency of other specified B group vitamins: Secondary | ICD-10-CM | POA: Diagnosis not present

## 2023-05-21 DIAGNOSIS — R1084 Generalized abdominal pain: Secondary | ICD-10-CM | POA: Diagnosis not present

## 2023-05-21 DIAGNOSIS — R198 Other specified symptoms and signs involving the digestive system and abdomen: Secondary | ICD-10-CM | POA: Insufficient documentation

## 2023-05-21 NOTE — Addendum Note (Signed)
Addended by: Jarvis Morgan D on: 05/21/2023 11:07 AM   Modules accepted: Orders

## 2023-05-22 LAB — CBC WITH DIFFERENTIAL/PLATELET
Basophils Absolute: 0.1 10*3/uL (ref 0.0–0.1)
Basophils Relative: 0.8 % (ref 0.0–3.0)
Eosinophils Absolute: 0.6 10*3/uL (ref 0.0–0.7)
Eosinophils Relative: 7.1 % — ABNORMAL HIGH (ref 0.0–5.0)
HCT: 38.2 % (ref 36.0–46.0)
Hemoglobin: 13 g/dL (ref 12.0–15.0)
Lymphocytes Relative: 28.7 % (ref 12.0–46.0)
Lymphs Abs: 2.2 10*3/uL (ref 0.7–4.0)
MCHC: 34 g/dL (ref 30.0–36.0)
MCV: 92.1 fL (ref 78.0–100.0)
Monocytes Absolute: 0.5 10*3/uL (ref 0.1–1.0)
Monocytes Relative: 6.9 % (ref 3.0–12.0)
Neutro Abs: 4.4 10*3/uL (ref 1.4–7.7)
Neutrophils Relative %: 56.5 % (ref 43.0–77.0)
Platelets: 248 10*3/uL (ref 150.0–400.0)
RBC: 4.14 Mil/uL (ref 3.87–5.11)
RDW: 13.7 % (ref 11.5–15.5)
WBC: 7.8 10*3/uL (ref 4.0–10.5)

## 2023-05-23 ENCOUNTER — Other Ambulatory Visit: Payer: Self-pay | Admitting: Family

## 2023-05-23 ENCOUNTER — Other Ambulatory Visit: Payer: Self-pay

## 2023-05-23 DIAGNOSIS — R899 Unspecified abnormal finding in specimens from other organs, systems and tissues: Secondary | ICD-10-CM

## 2023-05-28 ENCOUNTER — Other Ambulatory Visit: Payer: Self-pay | Admitting: Family

## 2023-05-28 DIAGNOSIS — R899 Unspecified abnormal finding in specimens from other organs, systems and tissues: Secondary | ICD-10-CM

## 2023-05-28 DIAGNOSIS — R9389 Abnormal findings on diagnostic imaging of other specified body structures: Secondary | ICD-10-CM

## 2023-05-29 ENCOUNTER — Telehealth: Payer: Self-pay | Admitting: Family

## 2023-05-29 NOTE — Telephone Encounter (Signed)
Lft pt vm to call ofc to sch US's. thanks 

## 2023-05-30 ENCOUNTER — Telehealth: Payer: Self-pay | Admitting: Family

## 2023-05-30 NOTE — Telephone Encounter (Signed)
Lft pt vm to call ofc to sch US's. thanks 

## 2023-06-10 ENCOUNTER — Ambulatory Visit
Admission: RE | Admit: 2023-06-10 | Discharge: 2023-06-10 | Disposition: A | Discharge status: Home / Self Care | Payer: BC Managed Care – PPO | Source: Ambulatory Visit | Attending: Family | Admitting: Family

## 2023-06-10 ENCOUNTER — Encounter: Payer: Self-pay | Admitting: Family

## 2023-06-10 DIAGNOSIS — N838 Other noninflammatory disorders of ovary, fallopian tube and broad ligament: Secondary | ICD-10-CM | POA: Diagnosis not present

## 2023-06-10 DIAGNOSIS — R899 Unspecified abnormal finding in specimens from other organs, systems and tissues: Secondary | ICD-10-CM | POA: Insufficient documentation

## 2023-06-10 DIAGNOSIS — R9389 Abnormal findings on diagnostic imaging of other specified body structures: Secondary | ICD-10-CM

## 2023-06-10 DIAGNOSIS — N854 Malposition of uterus: Secondary | ICD-10-CM | POA: Diagnosis not present

## 2023-06-10 DIAGNOSIS — N858 Other specified noninflammatory disorders of uterus: Secondary | ICD-10-CM | POA: Diagnosis not present

## 2023-06-10 DIAGNOSIS — Z0189 Encounter for other specified special examinations: Secondary | ICD-10-CM | POA: Diagnosis not present

## 2023-06-13 ENCOUNTER — Other Ambulatory Visit: Payer: BC Managed Care – PPO

## 2023-06-13 ENCOUNTER — Encounter: Payer: Self-pay | Admitting: Family

## 2023-06-13 DIAGNOSIS — R899 Unspecified abnormal finding in specimens from other organs, systems and tissues: Secondary | ICD-10-CM

## 2023-06-13 LAB — CBC WITH DIFFERENTIAL/PLATELET
Basophils Absolute: 0 10*3/uL (ref 0.0–0.1)
Basophils Relative: 0.3 % (ref 0.0–3.0)
Eosinophils Absolute: 0.2 10*3/uL (ref 0.0–0.7)
Eosinophils Relative: 3.3 % (ref 0.0–5.0)
HCT: 40.2 % (ref 36.0–46.0)
Hemoglobin: 13.5 g/dL (ref 12.0–15.0)
Lymphocytes Relative: 20.4 % (ref 12.0–46.0)
Lymphs Abs: 1.4 10*3/uL (ref 0.7–4.0)
MCHC: 33.5 g/dL (ref 30.0–36.0)
MCV: 92.1 fL (ref 78.0–100.0)
Monocytes Absolute: 0.4 10*3/uL (ref 0.1–1.0)
Monocytes Relative: 5.9 % (ref 3.0–12.0)
Neutro Abs: 5 10*3/uL (ref 1.4–7.7)
Neutrophils Relative %: 70.1 % (ref 43.0–77.0)
Platelets: 233 10*3/uL (ref 150.0–400.0)
RBC: 4.36 Mil/uL (ref 3.87–5.11)
RDW: 13.3 % (ref 11.5–15.5)
WBC: 7.1 10*3/uL (ref 4.0–10.5)

## 2023-06-17 LAB — CA 125: CA 125: 15 U/mL (ref <35)

## 2023-08-15 ENCOUNTER — Ambulatory Visit: Payer: BC Managed Care – PPO | Admitting: Family

## 2023-10-25 ENCOUNTER — Encounter: Payer: Self-pay | Admitting: Family

## 2023-10-25 ENCOUNTER — Other Ambulatory Visit: Payer: Self-pay | Admitting: Family

## 2023-10-25 ENCOUNTER — Ambulatory Visit: Admitting: Family

## 2023-10-25 VITALS — BP 126/76 | HR 68 | Temp 97.8°F | Ht 63.0 in | Wt 163.8 lb

## 2023-10-25 DIAGNOSIS — N6310 Unspecified lump in the right breast, unspecified quadrant: Secondary | ICD-10-CM

## 2023-10-25 DIAGNOSIS — N83201 Unspecified ovarian cyst, right side: Secondary | ICD-10-CM | POA: Diagnosis not present

## 2023-10-25 NOTE — Progress Notes (Signed)
 Assessment & Plan:  Mass of right breast, unspecified quadrant Assessment & Plan: Right breast asymmetry on exam.  Pending diagnostic images.  Orders: -     US  LIMITED ULTRASOUND INCLUDING AXILLA RIGHT BREAST; Future -     MM 3D DIAGNOSTIC MAMMOGRAM UNILATERAL RIGHT BREAST; Future  Right ovarian cyst Assessment & Plan: Menses, menstrual cramping at baseline at this time.  Pending repeat transvaginal ultrasound.  Will plan to share ultrasound result with [facility]  Orders: -     US  PELVIC COMPLETE WITH TRANSVAGINAL; Future     Return precautions given.   Risks, benefits, and alternatives of the medications and treatment plan prescribed today were discussed, and patient expressed understanding.   Education regarding symptom management and diagnosis given to patient on AVS either electronically or printed.  No follow-ups on file.  Bascom Bossier, FNP  Subjective:    Patient ID: Kiara Rich, female    DOB: 1985-08-05, 38 y.o.   MRN: [id number]  CC: Kiara Rich is a 38 y.o. female who presents today for an acute visit.    HPI: Right  breast mass approx 6 oclock when doing SBE a couple of weeks ago. Unchanged in size.   Somewhat 'sore' and she suspects from feeling the area.         Right US  and BL  diag mammogram obtained 08/02/2020 palpable right breast benign cyst.  No evidence of malignancy in either breast  Following with Dr Shellee Devonshire [facility]  Menses are unchanged, monthly.  On occasion she will have pelvic pain and cramping.  Not severe at this time.    Vaginal ultrasound 06/11/2023, simple endometrial cyst, right ovarian cystic lesion complex follicle versus endometrioma. Allergies: Penicillins Current Outpatient Medications on File Prior to Visit  Medication Sig Dispense Refill   cyanocobalamin  (,VITAMIN B-12,) 1000 MCG/ML injection 1000 mcg (1 mL) intramuscular injection in the thigh ( vastus lateralis)  once per week for four weeks, followed by 1000 mcg IM injection once per month. 1 mL 15   Multiple Vitamin (MULTIVITAMIN ADULT PO) Take 1 tablet by mouth daily.     triamcinolone  ointment (KENALOG ) 0.1 % apply to affected areas twice a day for 2 weeks. Avoid applying to face, groin, and axilla. Use as directed. Risk of skin atrophy with long-term use reviewed. 80 g 0   No current facility-administered medications on file prior to visit.    Review of Systems  Constitutional:  Negative for chills and fever.  Respiratory:  Negative for cough.   Cardiovascular:  Negative for chest pain and palpitations.  Gastrointestinal:  Negative for nausea and vomiting.      Objective:    BP 126/76   Pulse 68   Temp 97.8 F (36.6 C) (Oral)   Ht 5\' 3"  (1.6 m)   Wt 163 lb 12.8 oz (74.3 kg)   LMP  (LMP Unknown)   SpO2 98%   BMI 29.02 kg/m   BP Readings from Last 3 Encounters:  10/25/23 126/76  05/15/23 118/78  03/12/22 122/80   Wt Readings from Last 3 Encounters:  10/25/23 163 lb 12.8 oz (74.3 kg)  05/15/23 168 lb (76.2 kg)  03/12/22 172 lb (78 kg)    Physical Exam Vitals reviewed.  Constitutional:      Appearance: She is well-developed.  Eyes:     Conjunctiva/sclera: Conjunctivae normal.  Neck:     Thyroid : No thyroid  mass or thyromegaly.  Cardiovascular:  Rate and Rhythm: Normal rate and regular rhythm.     Pulses: Normal pulses.     Heart sounds: Normal heart sounds.  Pulmonary:     Effort: Pulmonary effort is normal.     Breath sounds: Normal breath sounds. No wheezing, rhonchi or rales.  Chest:  Breasts:    Breasts are symmetrical.     Right: Mass present. No inverted nipple, nipple discharge, skin change or tenderness.     Left: No inverted nipple, mass, nipple discharge, skin change or tenderness.       Comments: Nontender asymmetry beneath right nipple approximately 6:00. Lymphadenopathy:     Head:     Right side of head: No submental, submandibular, tonsillar,  preauricular, posterior auricular or occipital adenopathy.     Left side of head: No submental, submandibular, tonsillar, preauricular, posterior auricular or occipital adenopathy.     Cervical: No cervical adenopathy.     Right cervical: No superficial, deep or posterior cervical adenopathy.    Left cervical: No superficial, deep or posterior cervical adenopathy.  Skin:    General: Skin is warm and dry.  Neurological:     Mental Status: She is alert.  Psychiatric:        Speech: Speech normal.        Behavior: Behavior normal.        Thought Content: Thought content normal.

## 2023-10-25 NOTE — Assessment & Plan Note (Signed)
 Right breast asymmetry on exam.  Pending diagnostic images.

## 2023-10-25 NOTE — Patient Instructions (Addendum)
 Please call  and schedule your 3D mammogram and /or bone density scan as we discussed.   [facility]  ( new location in 2023)  [address], [place], [place] [address]  [place], [place]  [phone]   I have also ordered a transvaginal ultrasound to follow-up right ovarian cyst  Let us  know if you dont hear back within a week in regards to an appointment being scheduled.   So that you are aware, if you are [facility] MyChart user , please pay attention to your MyChart messages as you may receive a MyChart message with a phone number to call and schedule this test/appointment own your own from our referral coordinator. This is a new process so I do not want you to miss this message.  If you are not a MyChart user, you will receive a phone call.

## 2023-10-25 NOTE — Assessment & Plan Note (Signed)
 Menses, menstrual cramping at baseline at this time.  Pending repeat transvaginal ultrasound.  Will plan to share ultrasound result with [facility]

## 2023-10-29 ENCOUNTER — Ambulatory Visit
Admission: RE | Admit: 2023-10-29 | Discharge: 2023-10-29 | Disposition: A | Discharge status: Home / Self Care | Source: Ambulatory Visit | Attending: Family | Admitting: Family

## 2023-10-29 ENCOUNTER — Ambulatory Visit: Payer: Self-pay | Admitting: Family

## 2023-10-29 DIAGNOSIS — N6001 Solitary cyst of right breast: Secondary | ICD-10-CM | POA: Diagnosis not present

## 2023-10-29 DIAGNOSIS — N6341 Unspecified lump in right breast, subareolar: Secondary | ICD-10-CM | POA: Diagnosis not present

## 2023-10-29 DIAGNOSIS — N6310 Unspecified lump in the right breast, unspecified quadrant: Secondary | ICD-10-CM

## 2023-10-29 DIAGNOSIS — N6313 Unspecified lump in the right breast, lower outer quadrant: Secondary | ICD-10-CM | POA: Diagnosis not present

## 2023-10-29 DIAGNOSIS — N6314 Unspecified lump in the right breast, lower inner quadrant: Secondary | ICD-10-CM | POA: Diagnosis not present
